# Patient Record
Sex: Female | Born: 1935 | Race: White | Hispanic: No | Marital: Married
Health system: Southern US, Community
[De-identification: ages and names within clinical notes are randomized; demographics above are authoritative.]

---

## 2005-01-29 ENCOUNTER — Ambulatory Visit: Payer: Self-pay | Admitting: Internal Medicine

## 2010-05-27 ENCOUNTER — Ambulatory Visit: Payer: Self-pay | Admitting: Family Medicine

## 2011-06-22 ENCOUNTER — Ambulatory Visit: Payer: Self-pay | Admitting: Family Medicine

## 2011-07-21 ENCOUNTER — Ambulatory Visit: Payer: Self-pay | Admitting: Emergency Medicine

## 2011-08-10 ENCOUNTER — Ambulatory Visit: Payer: Self-pay | Admitting: Gastroenterology

## 2011-10-13 IMAGING — CT CT ABD-PELV W/ CM
1 of 3 series · 12 of 32 positions shown, 18 images · IV contrast (agent unspecified)
Comparison: none

REASON FOR EXAM: abd pain   ADD ON CR  [id number]
COMMENTS:

PROCEDURE:     CT  - CT ABDOMEN / PELVIS  W  - July 21, 2011  [DATE]
RESULT:
TECHNIQUE: CT of the abdomen and pelvis is performed utilizing 85 ml of
Msovue-TUW iodinated intravenous contrast with images reconstructed at
mm slice thickness in the axial plane and coronal plane with post contrast
delayed images reconstructed at 3.0 mm slice thickness.
There is no previous exam for comparison.

[Series 2: 3mm soft tissue · axial · 0.83mm/px · z∈[-502,-130]mm · 12 of 146 slices shown, 18 images]
[im 11/146  soft-tissue]
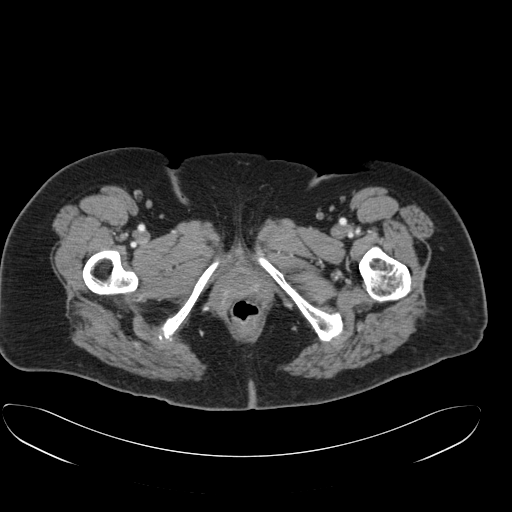
[im 11/146  bone]
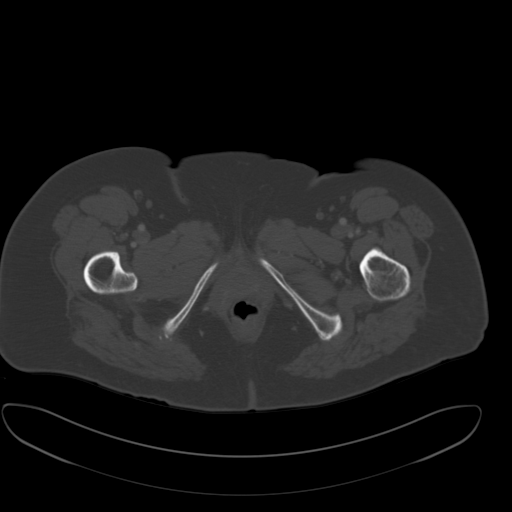
[im 21/146  soft-tissue]
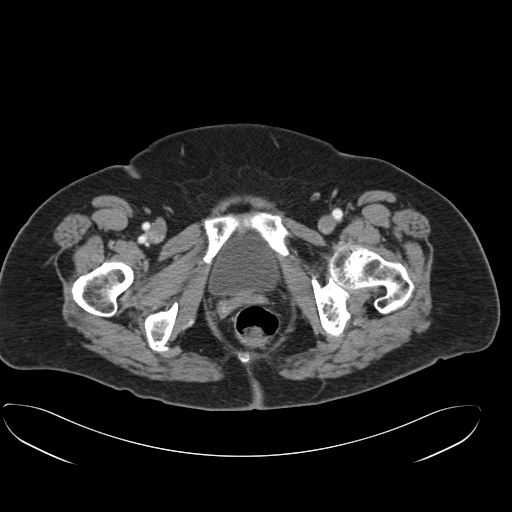
[im 32/146  soft-tissue]
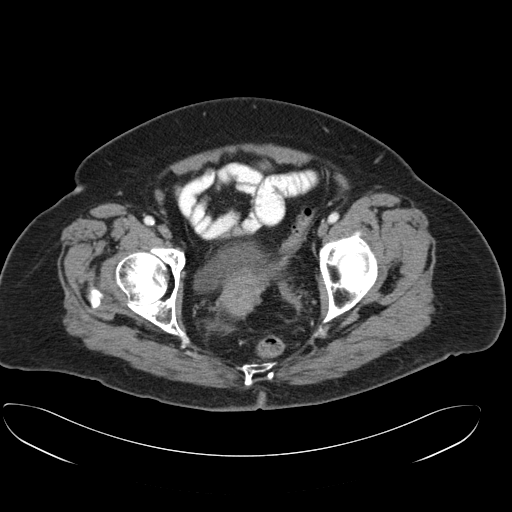
[im 42/146  soft-tissue]
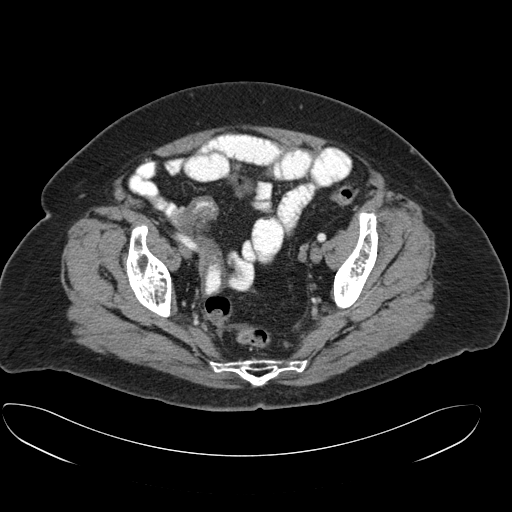
[im 52/146  soft-tissue]
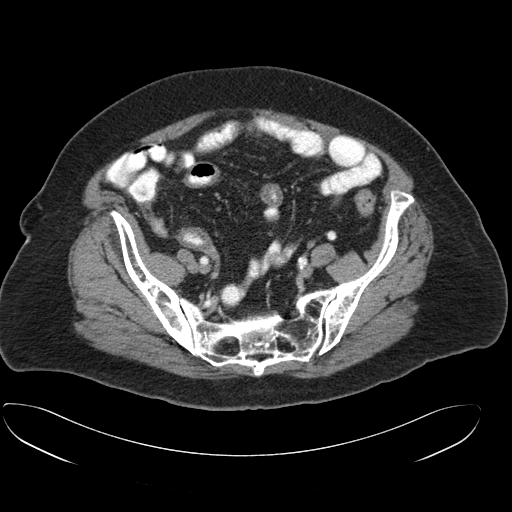
[im 63/146  soft-tissue]
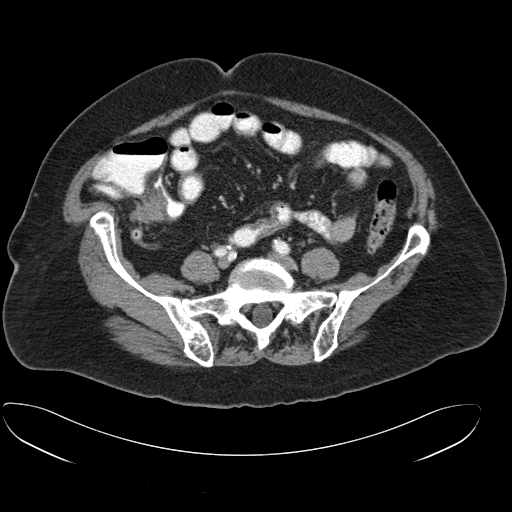
[im 83/146  soft-tissue]
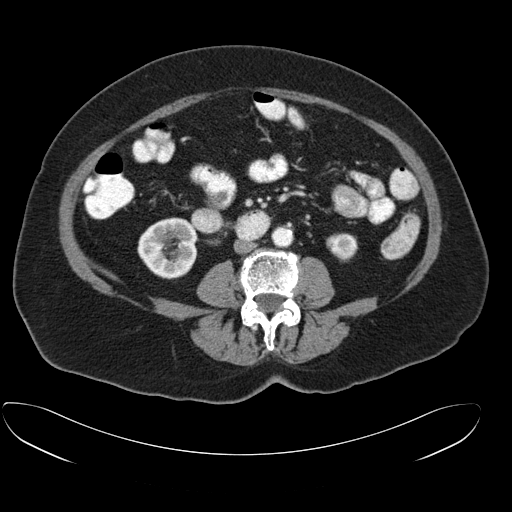
[im 94/146  soft-tissue]
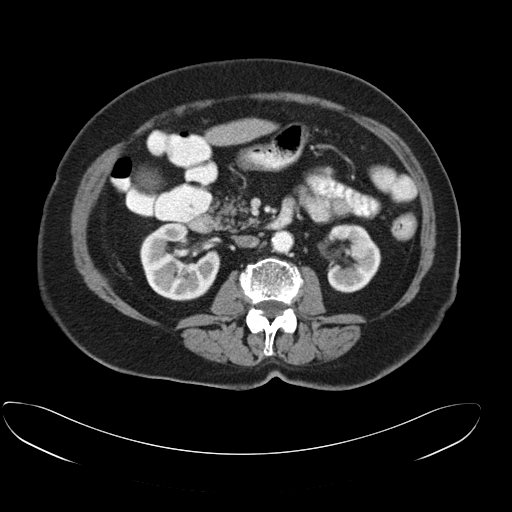
[im 104/146  soft-tissue]
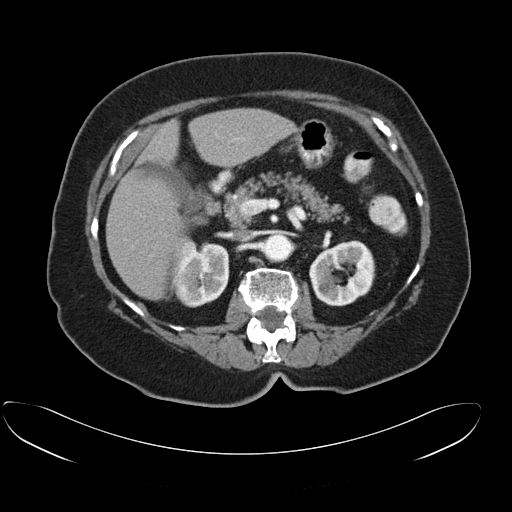
[im 104/146  lung]
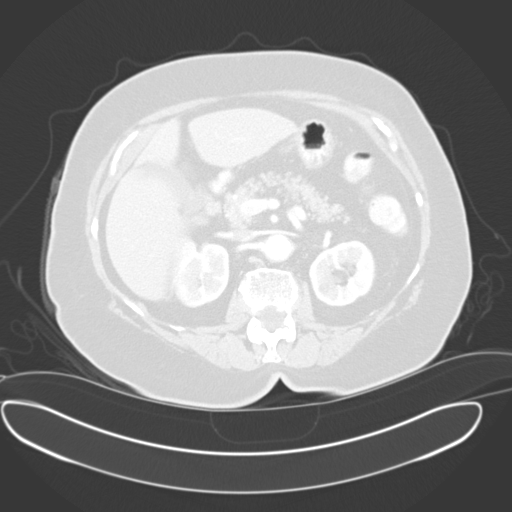
[im 104/146  bone]
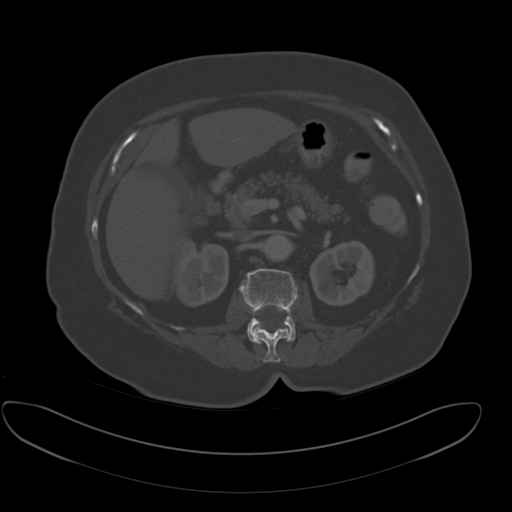
[im 114/146  soft-tissue]
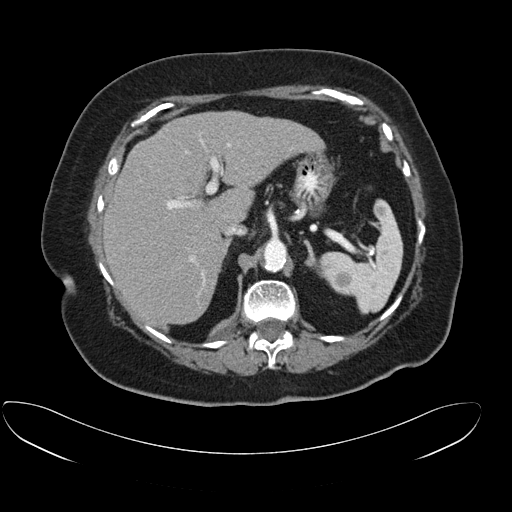
[im 114/146  lung]
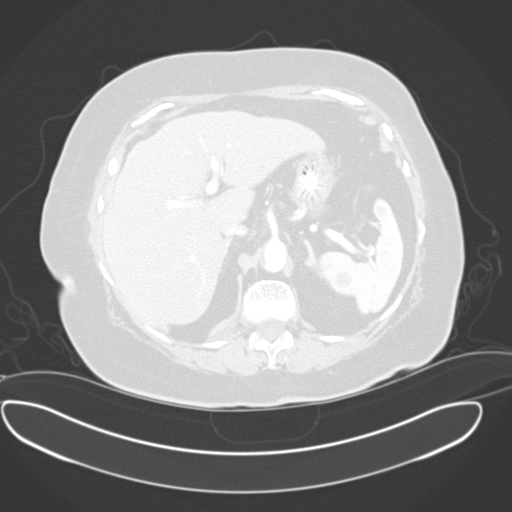
[im 125/146  soft-tissue]
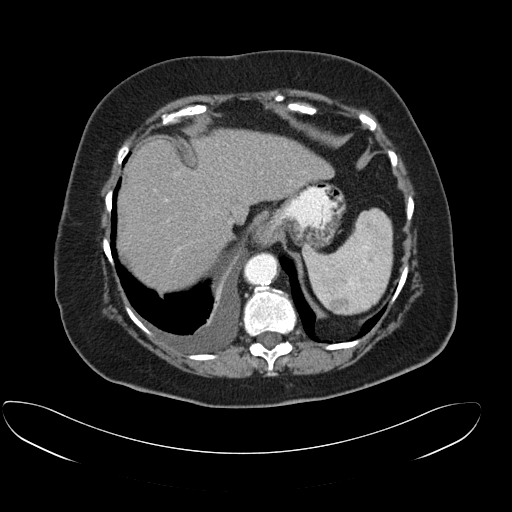
[im 125/146  lung]
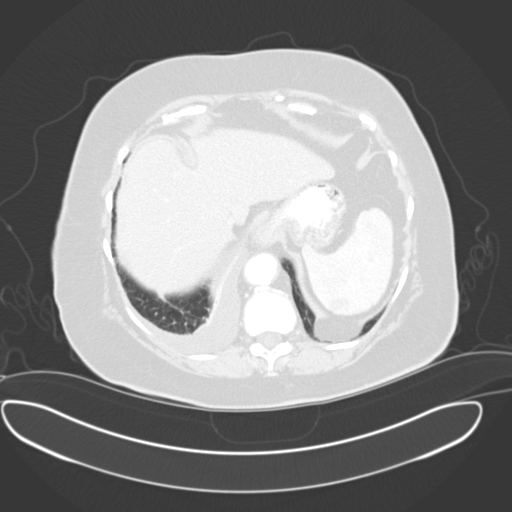
[im 135/146  soft-tissue]
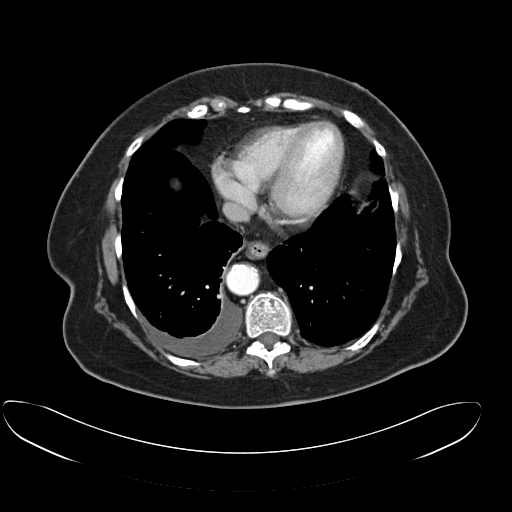
[im 135/146  lung]
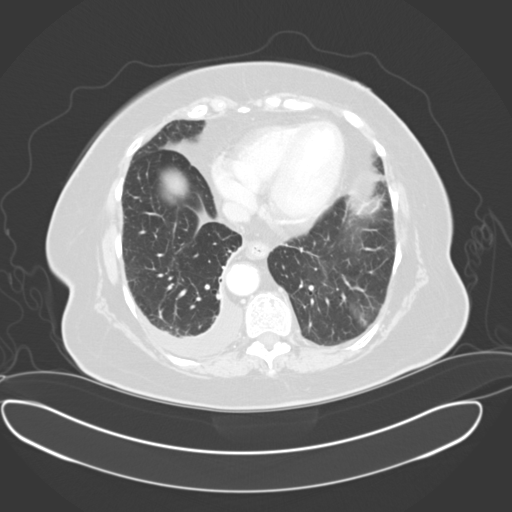

[12 of 32 positions shown; findings below may reference images not displayed]

FINDINGS: There is a small, right pleural effusion with some compressive
atelectasis in the right lower lobe. The immediate post contrast images
through the spleen show a heterogeneous pattern of enhancement with areas of
rounded, decreased enhancement without definite globular peripheral pooling
or puddling of contrast. However, on the delayed post contrast images, the
spleen shows a homogeneous pattern of enhancement suggesting that the areas
are likely hemangiomas. The gastric fundus shows slight thickening in the
wall possibly present; however, the stomach is not fully distended and as
such this could be artifact. Correlate for gastritis. The small bowel shows
intermittent areas of thickening of the wall with involvement included in
the terminal ileum. Correlate for enteritis including Crohn's disease. There
is no perforation or abscess. Colonic diverticulosis is present without
definite CT evidence of acute diverticulitis or abscess. The uterus is
present. No adnexal mass is appreciated. There is no significant ascites.
The kidneys show no obstruction or discrete mass. The adrenal glands appear
unremarkable. The pancreas, gallbladder and liver appear unremarkable. The
heart is normal in size. The lung bases show some atelectasis or fibrosis in
the lingula. The bony structures show no acute abnormality. Degenerative
changes are present with some disc space narrowing and degenerative
end-plate spurring.
IMPRESSION: 1.  Findings concerning for enteritis, possibly secondary to Crohn's
disease. An infectious rather than inflammatory etiology is not excluded.
Ischemic etiology is felt to be less likely given the relatively small
amount of atherosclerotic calcification and a grossly normal appearance of
the superior mesenteric artery origin and proximal portion.
2.  Colonic diverticular disease.
3.  Cannot exclude gastric wall thickening suggestive of gastritis.
Correlate with endoscopy.
4.  Small, right pleural effusion.
5.  Presumed splenic hemangiomata.

The findings were discussed with the requesting physician at the completion
of the exam.

## 2012-02-25 ENCOUNTER — Inpatient Hospital Stay: Payer: Self-pay | Admitting: Internal Medicine

## 2012-02-25 LAB — BASIC METABOLIC PANEL
Anion Gap: 14 (ref 7–16)
BUN: 15 mg/dL (ref 7–18)
Calcium, Total: 8.4 mg/dL — ABNORMAL LOW (ref 8.5–10.1)
EGFR (African American): >60
EGFR (Non-African Amer.): 57 — ABNORMAL LOW
Glucose: 107 mg/dL — ABNORMAL HIGH (ref 65–99)
Potassium: 3.8 mmol/L (ref 3.5–5.1)

## 2012-02-25 LAB — CBC
HCT: 24.1 % — ABNORMAL LOW (ref 35.0–47.0)
HGB: 7.2 g/dL — ABNORMAL LOW (ref 12.0–16.0)
MCH: 26.9 pg (ref 26.0–34.0)

## 2012-02-25 LAB — TROPONIN I: Troponin-I: 0.07 ng/mL — ABNORMAL HIGH

## 2012-02-25 LAB — CK TOTAL AND CKMB (NOT AT ARMC): CK-MB: 0.9 ng/mL (ref 0.5–3.6)

## 2012-02-26 LAB — TROPONIN I: Troponin-I: 0.07 ng/mL — ABNORMAL HIGH

## 2012-02-26 LAB — CBC WITH DIFFERENTIAL/PLATELET
Basophil #: 0.1 10*3/uL (ref 0.0–0.1)
Basophil %: 0.7 %
Eosinophil #: 0.2 10*3/uL (ref 0.0–0.7)
HGB: 8.1 g/dL — ABNORMAL LOW (ref 12.0–16.0)
Lymphocyte #: 1.5 10*3/uL (ref 1.0–3.6)
Lymphocyte %: 14.7 %
MCHC: 30.5 g/dL — ABNORMAL LOW (ref 32.0–36.0)
MCV: 88 fL (ref 80–100)
Monocyte #: 0.9 x10 3/mm (ref 0.2–0.9)
RDW: 15 % — ABNORMAL HIGH (ref 11.5–14.5)
WBC: 10.4 10*3/uL (ref 3.6–11.0)

## 2012-02-26 LAB — CK TOTAL AND CKMB (NOT AT ARMC)
CK, Total: 93 U/L (ref 21–215)
CK-MB: 0.8 ng/mL (ref 0.5–3.6)

## 2012-02-26 LAB — BASIC METABOLIC PANEL
BUN: 12 mg/dL (ref 7–18)
Chloride: 113 mmol/L — ABNORMAL HIGH (ref 98–107)
Creatinine: 0.61 mg/dL (ref 0.60–1.30)
EGFR (African American): >60
Sodium: 143 mmol/L (ref 136–145)

## 2012-02-26 LAB — HEPATIC FUNCTION PANEL A (ARMC)
Alkaline Phosphatase: 74 U/L (ref 50–136)
Bilirubin, Direct: <0.1 mg/dL (ref 0.00–0.20)
Bilirubin,Total: 0.7 mg/dL (ref 0.2–1.0)
SGOT(AST): 59 U/L — ABNORMAL HIGH (ref 15–37)

## 2012-02-26 LAB — HEMOGLOBIN: HGB: 8.4 g/dL — ABNORMAL LOW (ref 12.0–16.0)

## 2012-02-27 LAB — HEMOGLOBIN: HGB: 8.2 g/dL — ABNORMAL LOW (ref 12.0–16.0)

## 2012-02-28 LAB — CBC WITH DIFFERENTIAL/PLATELET
Basophil #: 0 10*3/uL (ref 0.0–0.1)
Eosinophil #: 0 10*3/uL (ref 0.0–0.7)
HCT: 25.8 % — ABNORMAL LOW (ref 35.0–47.0)
HGB: 8.1 g/dL — ABNORMAL LOW (ref 12.0–16.0)
Lymphocyte %: 8.8 %
MCH: 27.1 pg (ref 26.0–34.0)
MCHC: 31.2 g/dL — ABNORMAL LOW (ref 32.0–36.0)
Monocyte %: 6.3 %
Neutrophil %: 84.4 %
Platelet: 225 10*3/uL (ref 150–440)
RDW: 14.2 % (ref 11.5–14.5)
WBC: 11 10*3/uL (ref 3.6–11.0)

## 2012-02-29 LAB — URINALYSIS, COMPLETE
Bacteria: NONE SEEN
Glucose,UR: NEGATIVE mg/dL (ref 0–75)
Ketone: NEGATIVE
Leukocyte Esterase: NEGATIVE
Nitrite: NEGATIVE
Protein: NEGATIVE
RBC,UR: 1 /HPF (ref 0–5)
Specific Gravity: 1.004 (ref 1.003–1.030)
Squamous Epithelial: NONE SEEN

## 2012-02-29 LAB — HEMOGLOBIN: HGB: 7.6 g/dL — ABNORMAL LOW (ref 12.0–16.0)

## 2012-03-01 LAB — CBC WITH DIFFERENTIAL/PLATELET
Basophil #: 0 10*3/uL (ref 0.0–0.1)
HCT: 31.4 % — ABNORMAL LOW (ref 35.0–47.0)
HGB: 10 g/dL — ABNORMAL LOW (ref 12.0–16.0)
MCH: 27 pg (ref 26.0–34.0)
MCHC: 31.8 g/dL — ABNORMAL LOW (ref 32.0–36.0)
MCV: 85 fL (ref 80–100)
Monocyte %: 9 %
Neutrophil %: 80.4 %
Platelet: 202 10*3/uL (ref 150–440)
RBC: 3.7 10*6/uL — ABNORMAL LOW (ref 3.80–5.20)
WBC: 11.2 10*3/uL — ABNORMAL HIGH (ref 3.6–11.0)

## 2012-03-02 ENCOUNTER — Ambulatory Visit: Payer: Self-pay | Admitting: Internal Medicine

## 2012-03-02 LAB — CBC WITH DIFFERENTIAL/PLATELET
Basophil #: 0 10*3/uL (ref 0.0–0.1)
Eosinophil #: 0.2 10*3/uL (ref 0.0–0.7)
Lymphocyte #: 1.2 10*3/uL (ref 1.0–3.6)
Lymphocyte %: 10 %
MCHC: 31.9 g/dL — ABNORMAL LOW (ref 32.0–36.0)
Monocyte %: 9.5 %
Neutrophil #: 9.4 10*3/uL — ABNORMAL HIGH (ref 1.4–6.5)
Neutrophil %: 78.4 %
RDW: 14.2 % (ref 11.5–14.5)
WBC: 12 10*3/uL — ABNORMAL HIGH (ref 3.6–11.0)

## 2012-03-02 LAB — BASIC METABOLIC PANEL
Calcium, Total: 8 mg/dL — ABNORMAL LOW (ref 8.5–10.1)
Chloride: 108 mmol/L — ABNORMAL HIGH (ref 98–107)
Co2: 25 mmol/L (ref 21–32)
Creatinine: 0.69 mg/dL (ref 0.60–1.30)
EGFR (Non-African Amer.): >60
Glucose: 105 mg/dL — ABNORMAL HIGH (ref 65–99)
Osmolality: 283 (ref 275–301)
Potassium: 2.9 mmol/L — ABNORMAL LOW (ref 3.5–5.1)

## 2012-03-03 LAB — BASIC METABOLIC PANEL
BUN: 5 mg/dL — ABNORMAL LOW (ref 7–18)
EGFR (African American): >60
EGFR (Non-African Amer.): >60
Potassium: 3.3 mmol/L — ABNORMAL LOW (ref 3.5–5.1)

## 2012-03-03 LAB — CBC WITH DIFFERENTIAL/PLATELET
Basophil #: 0.1 10*3/uL (ref 0.0–0.1)
Eosinophil #: 0.3 10*3/uL (ref 0.0–0.7)
Eosinophil %: 3.2 %
HCT: 32 % — ABNORMAL LOW (ref 35.0–47.0)
HGB: 10.4 g/dL — ABNORMAL LOW (ref 12.0–16.0)
Lymphocyte #: 1.2 10*3/uL (ref 1.0–3.6)
Lymphocyte %: 12.7 %
MCHC: 32.4 g/dL (ref 32.0–36.0)
MCV: 85 fL (ref 80–100)
Monocyte #: 1 x10 3/mm — ABNORMAL HIGH (ref 0.2–0.9)
Monocyte %: 11 %
Neutrophil %: 72 %
RBC: 3.76 10*6/uL — ABNORMAL LOW (ref 3.80–5.20)
RDW: 14.3 % (ref 11.5–14.5)

## 2012-03-05 LAB — BASIC METABOLIC PANEL
Anion Gap: 9 (ref 7–16)
BUN: 5 mg/dL — ABNORMAL LOW (ref 7–18)
Calcium, Total: 7.9 mg/dL — ABNORMAL LOW (ref 8.5–10.1)
Chloride: 110 mmol/L — ABNORMAL HIGH (ref 98–107)
Creatinine: 0.64 mg/dL (ref 0.60–1.30)
EGFR (African American): >60
Osmolality: 285 (ref 275–301)
Potassium: 3.1 mmol/L — ABNORMAL LOW (ref 3.5–5.1)

## 2012-03-05 LAB — CANCER ANTIGEN 19-9: CA 19-9: 169 U/mL — ABNORMAL HIGH (ref 0–35)

## 2012-03-05 LAB — CA 125: CA 125: 40.9 U/mL — ABNORMAL HIGH (ref 0.0–34.0)

## 2012-03-05 LAB — HEMOGLOBIN: HGB: 10 g/dL — ABNORMAL LOW (ref 12.0–16.0)

## 2012-03-05 LAB — CANCER ANTIGEN 27.29: CA 27.29: 9.1 U/mL (ref 0.0–38.6)

## 2012-03-05 LAB — MAGNESIUM: Magnesium: 1.8 mg/dL

## 2012-03-06 LAB — POTASSIUM: Potassium: 3.5 mmol/L (ref 3.5–5.1)

## 2012-03-07 ENCOUNTER — Emergency Department: Payer: Self-pay | Admitting: Emergency Medicine

## 2012-03-07 LAB — CBC
HCT: 35.6 % (ref 35.0–47.0)
HGB: 11.3 g/dL — ABNORMAL LOW (ref 12.0–16.0)
MCH: 27.1 pg (ref 26.0–34.0)
MCHC: 31.7 g/dL — ABNORMAL LOW (ref 32.0–36.0)
MCV: 85 fL (ref 80–100)
Platelet: 248 10*3/uL (ref 150–440)
RBC: 4.17 10*6/uL (ref 3.80–5.20)
RDW: 15 % — ABNORMAL HIGH (ref 11.5–14.5)
WBC: 9.9 10*3/uL (ref 3.6–11.0)

## 2012-03-07 LAB — COMPREHENSIVE METABOLIC PANEL
Albumin: 3.3 g/dL — ABNORMAL LOW (ref 3.4–5.0)
Alkaline Phosphatase: 114 U/L (ref 50–136)
Anion Gap: 9 (ref 7–16)
BUN: 12 mg/dL (ref 7–18)
Bilirubin,Total: 0.4 mg/dL (ref 0.2–1.0)
Creatinine: 0.67 mg/dL (ref 0.60–1.30)
EGFR (African American): >60
Glucose: 138 mg/dL — ABNORMAL HIGH (ref 65–99)
SGOT(AST): 44 U/L — ABNORMAL HIGH (ref 15–37)
Total Protein: 6.9 g/dL (ref 6.4–8.2)

## 2012-03-08 LAB — PATHOLOGY REPORT

## 2012-04-02 ENCOUNTER — Ambulatory Visit: Payer: Self-pay | Admitting: Internal Medicine

## 2012-05-30 IMAGING — CR DG ABDOMEN 3V
1 series · 4 of 4 positions shown · non-contrast
Comparison: none

REASON FOR EXAM: abdominal pain
COMMENTS:

[Series 4: x chest ap · 0.14mm/px · 4 of 4 slices shown]
[im 1/4]
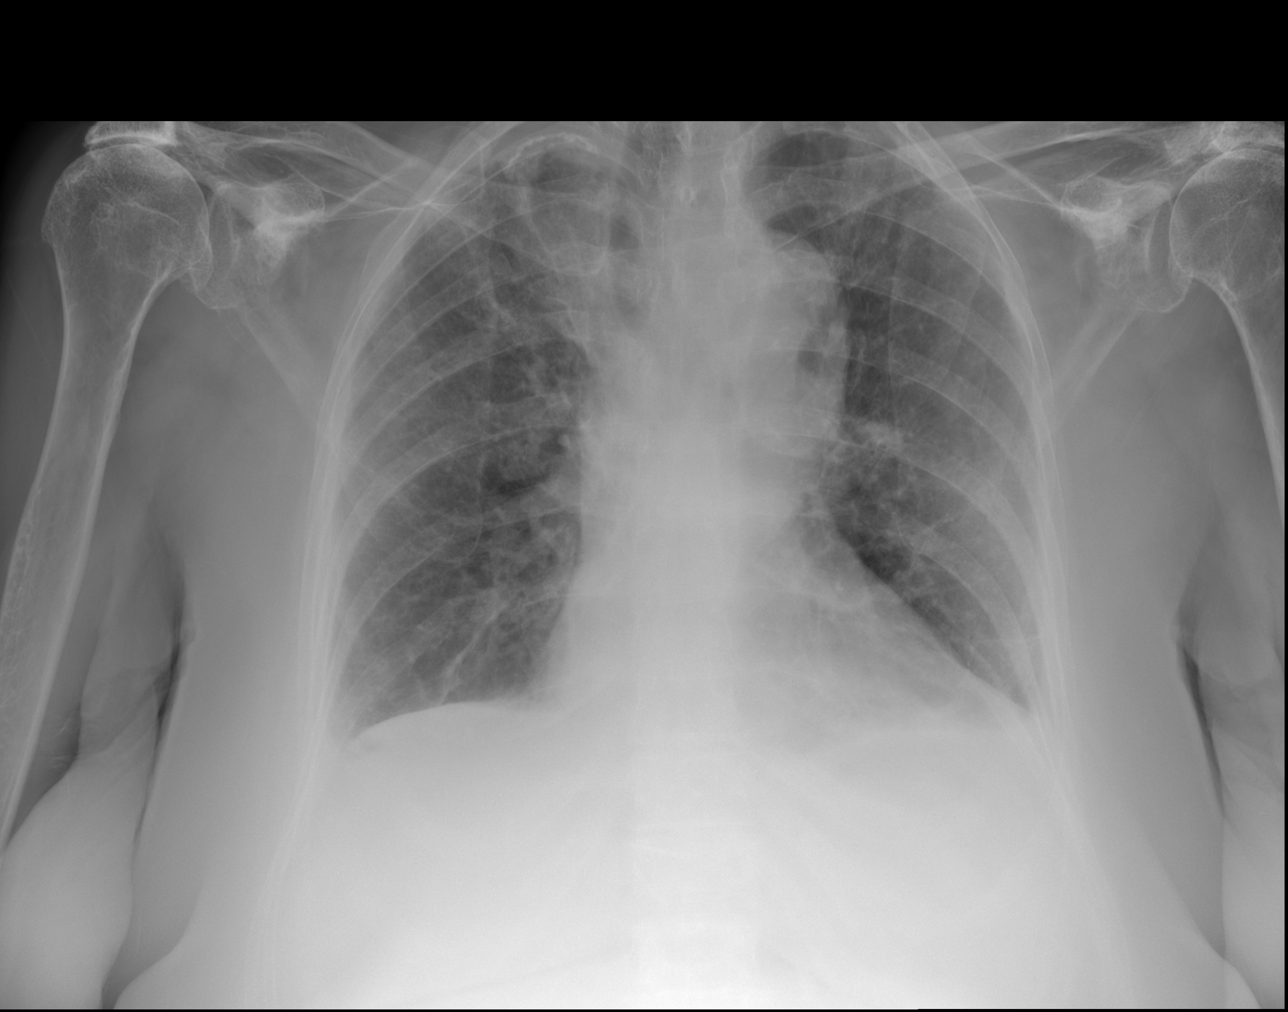
[im 2/4]
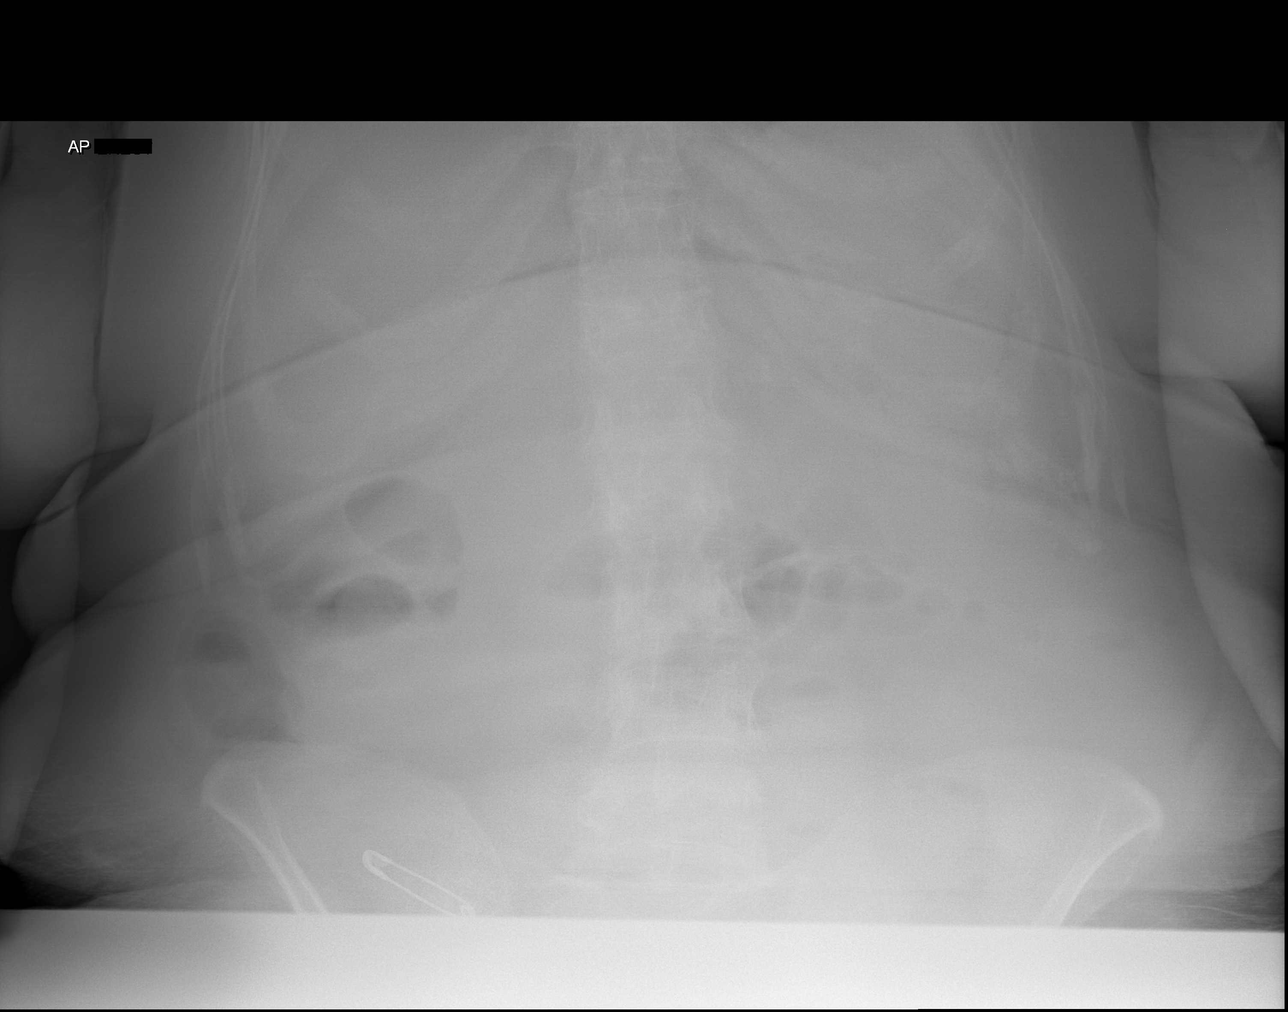
[im 3/4]
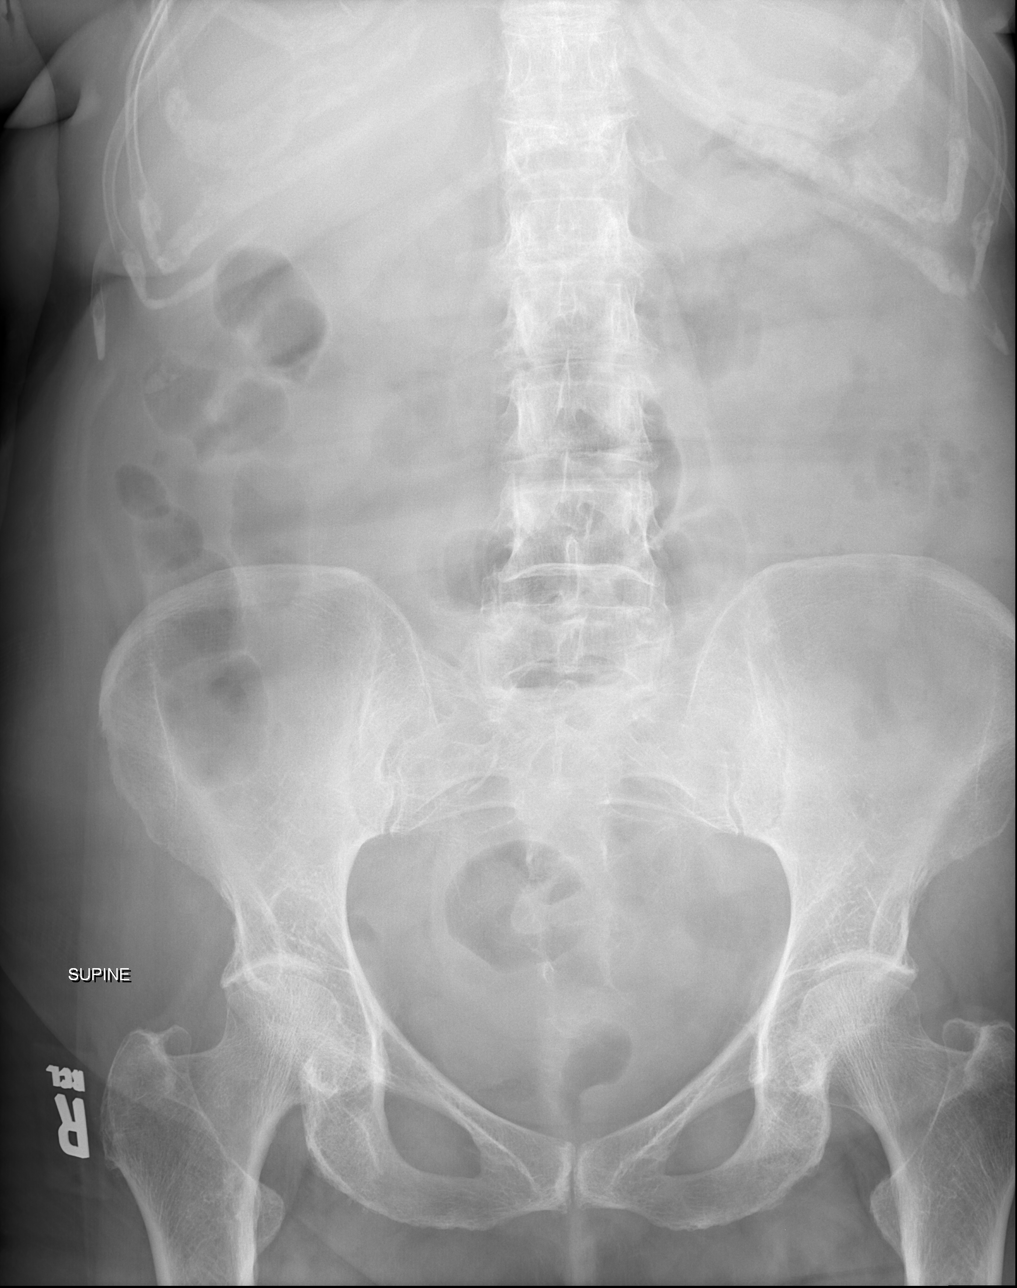
[im 4/4]
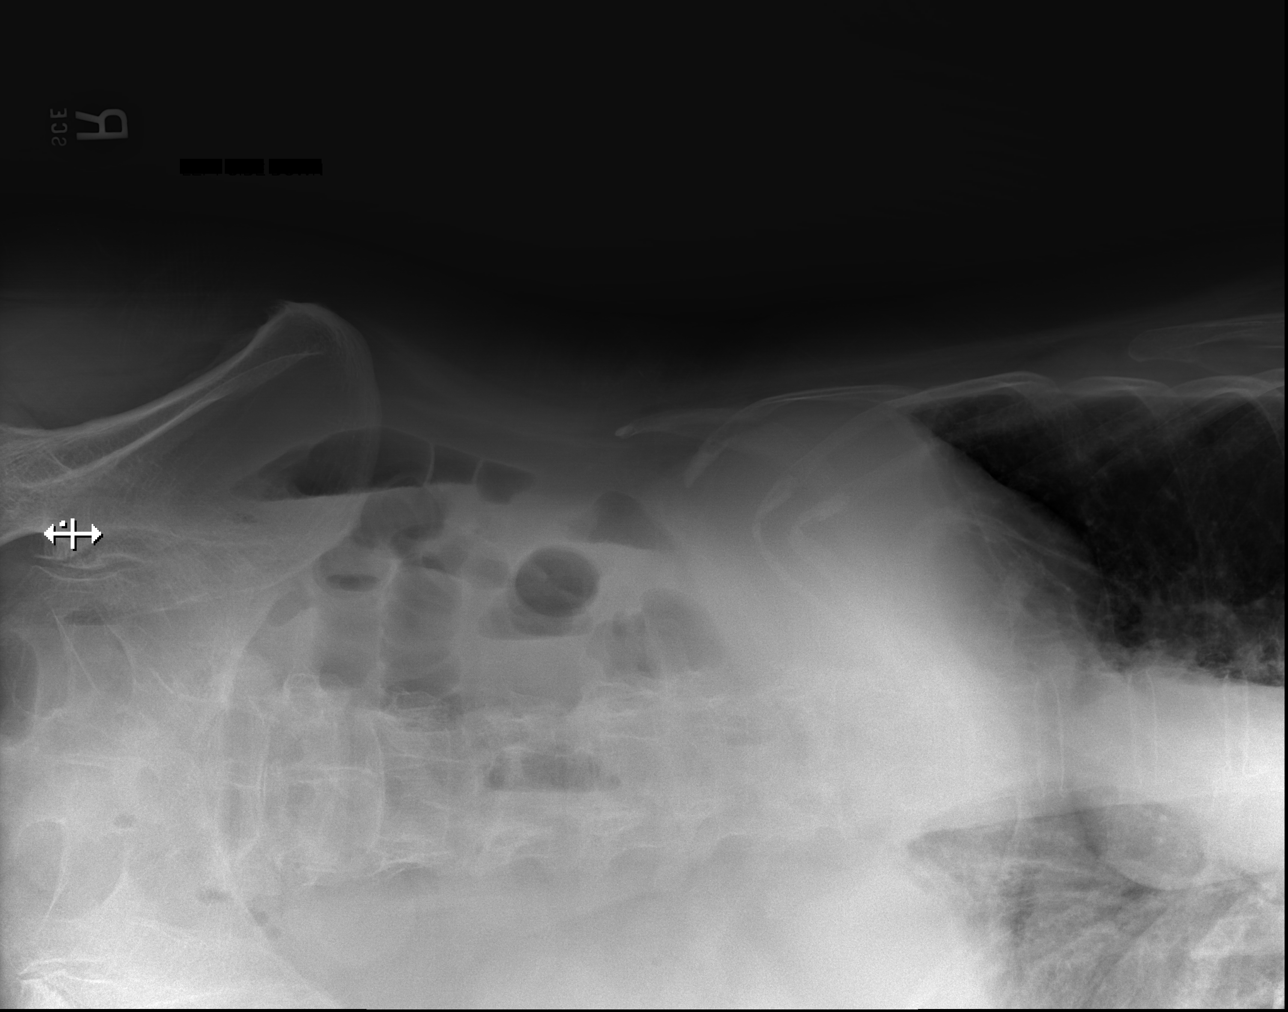

[4 of 4 positions shown; findings below may reference images not displayed]

PROCEDURE:     DXR - DXR ABDOMEN 3-WAY (INCL PA CXR)  - March 07, 2012 [DATE]

RESULT:

Comparison is made to a two view chest dated 03/03/2012.

Frontal view of the chest is performed.

The patient has taken a shallow inspiration. There is thickening of the
interstitial markings. Area of increased density projects in the left lung
base. There is blunting of the costophrenic angle. The cardiac silhouette is
moderately enlarged. An area of increased density projects in the right
upper lobe and slightly decreasing conspicuity when compared to the previous
study.

Air is seen within nondilated loops of large and small bowel. There is no
evidence of free air. The visualized bony skeleton is unremarkable.
IMPRESSION: 1.  Interstitial findings which may represent a component of pulmonary
fibrosis. Underlying component of pulmonary vascular congestion is also of
diagnostic consideration.
2.  Nonobstructive bowel gas pattern.

## 2013-03-02 DEATH — deceased

## 2015-02-24 NOTE — Consult Note (Signed)
EGD only showed hiatal hernia. No bleeding source found. Will keep on clear liquid diet. Will plan on colonoscopy on Wed. [facility] So, will not be able to schedule colon tomorrow. Pt aware. Thanks  Electronic Signatures: Lutricia Feilh, Glennda Weatherholtz (MD)  (Signed on 29-Apr-13 15:24)  Authored  Last Updated: 29-Apr-13 15:24 by Lutricia Feilh, Horace Wishon (MD)

## 2015-02-24 NOTE — Consult Note (Signed)
Chief Complaint:   Subjective/Chief Complaint Colonoscopy had to be postponed until tomorrow due to low K. Currently being repleted. Abd pain improved on liquid diet.   VITAL SIGNS/ANCILLARY NOTES: **Vital Signs.:   01-May-13 16:09   Vital Signs Type Routine   Temperature Temperature (F) 99.2   Celsius 37.3   Temperature Source oral   Pulse Pulse 70   Pulse source per Dinamap   Respirations Respirations 20   Systolic BP Systolic BP 481   Diastolic BP (mmHg) Diastolic BP (mmHg) 66   Mean BP 87   BP Source Dinamap   Pulse Ox % Pulse Ox % 92   Pulse Ox Activity Level  At rest   Oxygen Delivery Room Air/ 21 %   Brief Assessment:   Cardiac Regular    Respiratory clear BS    Gastrointestinal soft and min tenderness in low abdomen   Routine Hem:  01-May-13 04:49    WBC (CBC) 12.0   RBC (CBC) 3.82   Hemoglobin (CBC) 10.5   Hematocrit (CBC) 32.9   Platelet Count (CBC) 199   MCV 86   MCH 27.5   MCHC 31.9   RDW 14.2  Routine Chem:  01-May-13 04:49    Glucose, Serum 105   BUN 6   Creatinine (comp) 0.69   Sodium, Serum 143   Potassium, Serum 2.9   Chloride, Serum 108   CO2, Serum 25   Calcium (Total), Serum 8.0   Anion Gap 10   Osmolality (calc) 283   eGFR (African American) >60   eGFR (Non-African American) >60  Routine Hem:  01-May-13 04:49    Neutrophil % 78.4   Lymphocyte % 10.0   Monocyte % 9.5   Eosinophil % 1.7   Basophil % 0.4   Neutrophil # 9.4   Lymphocyte # 1.2   Monocyte # 1.1   Eosinophil # 0.2   Basophil # 0.0   Assessment/Plan:  Assessment/Plan:   Assessment Fe def anemia. Heme positive stool.    Plan Needs k to be above 3 tomorrow to plan colonoscopy. Thanks.   Electronic Signatures: Verdie Shire (MD)  (Signed 01-May-13 17:45)  Authored: Chief Complaint, VITAL SIGNS/ANCILLARY NOTES, Brief Assessment, Lab Results, Assessment/Plan   Last Updated: 01-May-13 17:45 by Verdie Shire (MD)

## 2015-02-24 NOTE — Consult Note (Signed)
Full consult to follow. Admitted with upper abd pain and cramping assoc with melena. Hgb low. Transfused last night. hHad HH on EGD last year with Dr. Cecelia ByarsHashmi. Video capsule neg. On advil prn. Hx of sigmoid polyp and sigmoid tics 6 yrs ago. Pt with rapid afib. HR still not under control. Initially had planned on doing EGD later today but will need to postpone until HR better controlled. Liquid diet ordered. Protonix iv. Moniter hgb. Dr. Niel HummerIftikhar to see patient over the weekend. Plan EGD on Monday? or sooner depending on her condition.Colonoscopy later if EGD neg. Thanks.    Electronic Signatures: Lutricia Feilh, Rainn Zupko (MD) (Signed on 26-Apr-13 11:53)  Authored   Last Updated: 26-Apr-13 11:54 by Lutricia Feilh, Vonnie Ligman (MD)

## 2015-02-24 NOTE — Consult Note (Signed)
Colonoscopy showed multiple left sided tics, possible sigmoid polyp vs fecal matter, prominent ileocecal valve. Unable to get direct view of cecum due to this valve. Biopsies prox to IC valve taken due to some erythema. Reviewed old CT report from 2012. Question of ileitis. Repeat CT of abd/pelvis with contrast please. Thanks.  Electronic Signatures: Lutricia Feilh, Henrietta Cieslewicz (MD)  (Signed on 02-May-13 13:59)  Authored  Last Updated: 02-May-13 13:59 by Lutricia Feilh, Brazos Sandoval (MD)

## 2015-02-24 NOTE — Consult Note (Signed)
Chief Complaint:   Subjective/Chief Complaint No more melena but hgb dropping. To receive transfusion today.Pt in NSR now.Though nurse stated she had some breakfast this AM, patient insists she had nothing to eat. Son ate part of meal. Had sip of gingerale at 11 AM. Requests EGD be done today if possible. Still with some abd pain.   VITAL SIGNS/ANCILLARY NOTES: **Vital Signs.:   29-Apr-13 10:00   Vital Signs Type Routine   Pulse Pulse 76   Respirations Respirations 22   Systolic BP Systolic BP 159   Diastolic BP (mmHg) Diastolic BP (mmHg) 69   Mean BP 99   Pulse Ox % Pulse Ox % 95   Pulse Ox Heart Rate 76   Brief Assessment:   Cardiac Regular    Respiratory clear BS    Gastrointestinal mild abd tenderness   Routine UA:  29-Apr-13 09:40    Clarity (UA) Clear   Glucose (UA) Negative   Bilirubin (UA) Negative   Ketones (UA) Negative   Specific Gravity (UA) 1.004   Blood (UA) 1+   pH (UA) 5.0   Protein (UA) Negative   Nitrite (UA) Negative   Leukocyte Esterase (UA) Negative   RBC (UA) 1 /HPF   WBC (UA) <1 /HPF   Mucous (UA) PRESENT   Assessment/Plan:  Assessment/Plan:   Assessment Abd pain. Anemia. Melena.    Plan Keep NPO. For EGD later this afternoon.   Electronic Signatures: Lutricia Feilh, Zharia Conrow (MD)  (Signed 29-Apr-13 12:02)  Authored: Chief Complaint, VITAL SIGNS/ANCILLARY NOTES, Brief Assessment, Lab Results, Assessment/Plan   Last Updated: 29-Apr-13 12:02 by Lutricia Feilh, Avalina Benko (MD)

## 2015-02-24 NOTE — Consult Note (Signed)
Chief Complaint:   Subjective/Chief Complaint Pt with minimal abd pain. Passage of old blood per rectum. Hgb remains stable.   VITAL SIGNS/ANCILLARY NOTES: **Vital Signs.:   05-May-13 10:19   Vital Signs Type Recheck   Systolic BP Systolic BP 155   Diastolic BP (mmHg) Diastolic BP (mmHg) 65   Mean BP 95   BP Source manual   Brief Assessment:   Respiratory clear BS    Gastrointestinal min abd tenderness   Routine Chem:  05-May-13 06:30    Potassium, Serum 3.5   Assessment/Plan:  Assessment/Plan:   Assessment Fed def anemia. Heme positive stool. Prob colon cancer.    Plan Agree that patient can be discharged to home today with f/u later once biopsies results are back. Thanks.   Electronic Signatures: Lutricia Feilh, Dustina Scoggin (MD)  (Signed [phone]-May-13 10:43)  Authored: Chief Complaint, VITAL SIGNS/ANCILLARY NOTES, Brief Assessment, Lab Results, Assessment/Plan   Last Updated: 05-May-13 10:43 by Lutricia Feilh, Jeury Mcnab (MD)

## 2015-02-24 NOTE — Consult Note (Signed)
PATIENT NAME:  Theresa Gaines, Theresa Gaines MR#:  [id number] DATE OF BIRTH:  07/18/1928  DATE OF CONSULTATION:  02/26/2012  REFERRING PHYSICIAN:   CONSULTING PHYSICIAN:  Ezzard Standing. Eileen Croswell, MD  REASON FOR REFERRAL: Melena and abdominal pain and cramping.   HISTORY OF PRESENT ILLNESS: The patient is an 81 year old white female with a history of hyperlipidemia and osteoporosis who was seen by her primary doctor yesterday and then was sent home. She has been complaining of abdominal cramping with pain on and off for the past two weeks or so. For the last several days, she has also noticed some melena as well. The pain is mostly in the upper abdomen. She has been taking some Advil p.r.n. for various reasons. In the emergency room, her hemoglobin was only 7.2. In the emergency room, she was also noted to have rapid atrial fibrillation with heart rate in the 140s, which is new onset. The patient denied having any prior cardiac history. There is no chest pain or shortness of breath.   She was initially admitted to the regular floor first, but then had to be transferred to the Intensive Care Unit earlier today because of consistently rapid heart rate. The patient is currently getting IV Cardizem. When I saw her, her heart rate fluctuated from 110 to sometimes even in the 140s.   The patient recalls having an endoscopy done by Dr. Jovita Gamma in September 2012. It is unclear whether it was for the same reason or not, but she have some abdominal cramping then too. The only thing she knows was that she had a hiatal hernia. There is no mention of ulcers then. She then had a video capsule study done the following month by Dr. Niel Hummer and she was told everything was normal.   PAST SURGICAL HISTORY: Colonoscopy by Dr. Caryn Section which showed a sigmoid polyp as well as sigmoid diverticula.  PAST MEDICAL HISTORY:  1. Osteoporosis.  2. Hyperlipidemia.   HOME MEDICATIONS: Allegra, Evista, iron, Prilosec over-the-counter, and Geritol.  She also takes Zocor and Zolpidem.   REVIEW OF SYSTEMS: There is really no change from the initial review of systems done by Dr. Allena Katz last night.   PHYSICAL EXAMINATION:   GENERAL: The patient is alert and oriented. She is in no distress.   VITALS: Heart rate is still rapid, although it is not as high as last night. Blood pressure is stable. Oxygen saturation is 100%.   HEENT: Normocephalic, atraumatic head. Pupils are equally reactive. Throat was clear.   NECK: Supple.   CARDIAC: Somewhat rapid tachycardic rhythm and somewhat irregular.  LUNGS: Clear bilaterally.   ABDOMEN: Normoactive bowel sounds. Abdomen is soft. There is some mild tenderness mostly in the mid to upper abdomen. There is no rebound or guarding. There is no hepatomegaly. There are no palpable masses.   EXTREMITIES: No clubbing, cyanosis, or edema.   NEUROLOGIC: Examination is intact. There is no focal deficits.  SKIN: Appears grossly normal.   LABS/STUDIES: Today sodium is 143, potassium 3.8, chloride 113, CO2 19, BUN 12, creatinine 1.6, and glucose 109. Liver enzymes showed albumin of 2.9 and AST 15. The rest of the liver enzymes are normal. Troponin level is 0.07. White count is 10.4, hemoglobin was 7.2 yesterday, and after blood transfusion it is 8.1 today.   ASSESSMENT AND PLAN: This is a patient with abdominal cramping and pain and melena with low hemoglobin. She has been taking some Advil. Even though the endoscopy done in September was supposedly negative for  any bleeding source, I think she would benefit from having a repeat endoscopy again. Unfortunately, the patient's heart rate is still not adequately controlled so instead of scheduling the upper endoscopy later today we will postpone it until the heart rate is better. We will keep the patient on clear liquids the rest of today. I will have Dr. Niel HummerIftikhar see her over the weekend and then decide what would be the best time to repeat endoscopy. It could be over  the weekend if she is actively bleeding or on Monday if she is relatively stable. She may benefit from having a repeat colonoscopy done at some point because of the history of polyps. If upper endoscopy is negative, then we will do the colonoscopy while she is in hospital. On the other hand, if a bleeding source is found, then we can do the colonoscopy at a later point as an outpatient.  ____________________________ Ezzard StandingPaul Y. Bluford Kaufmannh, MD pyo:slb D: 02/26/2012 14:11:36 ET T: 02/26/2012 15:00:21 ET JOB#: [id number]  cc: Ezzard StandingPaul Y. Bluford Kaufmannh, MD, <Dictator> Ezzard StandingPAUL Y Emberly Tomasso MD ELECTRONICALLY SIGNED 02/29/2012 9:04

## 2015-02-24 NOTE — Consult Note (Signed)
Chief Complaint:   Subjective/Chief Complaint Pt feels well. S/P scope yesterday. No furher evidence of bleeding. She denies pals or tachy. No cp   VITAL SIGNS/ANCILLARY NOTES: **Vital Signs.:   30-Apr-13 08:05   Vital Signs Type Routine   Temperature Temperature (F) 98.9   Celsius 37.1   Temperature Source oral   Pulse Pulse 69   Pulse source per Dinamap   Respirations Respirations 18   Systolic BP Systolic BP 148   Diastolic BP (mmHg) Diastolic BP (mmHg) 75   Mean BP 99   BP Source Dinamap   Pulse Ox % Pulse Ox % 92   Pulse Ox Activity Level  At rest   Oxygen Delivery Room Air/ 21 %  *Intake and Output.:   Daily 30-Apr-13 07:00   Grand Totals Intake:  1066.6 Output:  4465    Net:  -3398.4 24 Hr.:  -3398.4   Blood Product      In:  710   IV (Primary)      In:  16.6   IV (Primary)      In:  340   Urine ml     Out:  3465   Urine Post Catheter Insertion      Out:  1000   Length of Stay Totals Intake:  8566.1 Output:  8240    Net:  326.1   Brief Assessment:   Cardiac Regular    Respiratory normal resp effort  clear BS    Gastrointestinal Normal    Gastrointestinal details normal Soft  Nontender  Nondistended   Routine Hem:  30-Apr-13 04:45    WBC (CBC) 11.2   RBC (CBC) 3.70   Hemoglobin (CBC) 10.0   Hematocrit (CBC) 31.4   Platelet Count (CBC) 202   MCV 85   MCH 27.0   MCHC 31.8   RDW 14.3   Neutrophil % 80.4   Lymphocyte % 8.9   Monocyte % 9.0   Eosinophil % 1.6   Basophil % 0.1   Neutrophil # 9.0   Lymphocyte # 1.0   Monocyte # 1.0   Eosinophil # 0.2   Basophil # 0.0   Radiology Results: XRay:    28-Apr-13 09:05, Chest Portable Single View   Chest Portable Single View    REASON FOR EXAM:    SOB  COMMENTS:       PROCEDURE: DXR - DXR PORTABLE CHEST SINGLE VIEW  - Feb 28 2012  9:05AM     RESULT: Comparison: None    Findings:     Single portable AP chest radiograph is provided. There is bilateral   diffuse interstitial thickening likely  representing interstitial edema   versus interstitial pneumonitis secondary to an infectious or   inflammatory etiology. There bilateral trace pleural effusions. There is   no focal consolidation. There is no pneumothorax. Normal   cardiomediastinal silhouette. There is complete loss of the normal     acromiohumeral distance bilaterally as can be seen with rotator cuff   tears.    IMPRESSION:     There is bilateral diffuse interstitial thickening likely representing   interstitialedema versus interstitial pneumonitis secondary to an   infectious or inflammatory etiology.    Dictation Site: 3          Verified By: Joellyn Haff, M.D., MD  Cardiology:    25-Apr-13 12:21, ECG   Ventricular Rate 131   Atrial Rate 113   QRS Duration 66   QT 322   QTc 475   R  Axis 42   T Axis 92   ECG interpretation    Atrial fibrillation with rapid ventricular response  Nonspecific ST and T wave abnormality , probably digitalis effect  Abnormal ECG  When compared with ECG of 26-May-2000 14:34,  Atrial fibrillation has replaced Sinus rhythm  Nonspecific T wave abnormality now evident in Lateral leads  ----------unconfirmed----------  Confirmed by OVERREAD, NOT ([id number]), editor PEARSON, BARBARA ([id number]) on 02/26/2012 3:09:06 PM   ECG     25-Apr-13 18:58, Echo Doppler   Echo Doppler    Interpretation Summary    The left ventricle is grossly normal size. There is no thrombus. Left   ventricular systolic function is normal. Ejection Fraction = >55%.   There is normal left ventricular wall thickness. The left   ventricular wall motion is normal. The right ventricular systolic   function is normal. There is discrete nodular thickening of the left   coronary cusp. There is mild to moderate tricuspid regurgitation.   Right ventricular systolic pressure is elevated at 30-61mmHg.    PatientHeight: 170 cm    PatientWeight: 77 kg    BSA: 1.9 m2    Procedure:    A two-dimensional transthoracic  echocardiogram with color flow and   Doppler was performed.    The study was completed  bedside.    Technically a difficult study due to poorwindows.    Left Ventricle    Left ventricular systolic function is normal.    Ejection Fraction = >55%.    The left ventricular wall motion is normal.    The left ventricle is grossly normal size.    There is no thrombus.    There is normal left ventricular wall thickness.    Right Ventricle    The right ventricle is grossly normal size.    There is normal right ventricular wall thickness.    The right ventricular systolic function is normal.    Atria    The left atrial size is normal.    Right atrial size is normal.    Mitral Valve    The mitral valve leaflets appear thickened, but open well.    There is mild mitral regurgitation.    Tricuspid Valve    The tricuspid valve is not well visualized, but is grossly normal.    There is mild to moderate tricuspid regurgitation.    Right ventricular systolic pressure is elevated at 30-75mmHg.    Aortic Valve    There is discrete nodular thickening of the left coronary cusp.    No aortic regurgitation is present.    Pulmonic Valve    The pulmonic valve is not well seen, but is grossly normal.    There is no pulmonic valvular regurgitation.    Great Vessels    The aortic root is not well visualized but is probably normal size.    Pericardium/Pleural    There is no pleural effusion.    No pericardial effusion.    MMode 2D Measurements and Calculations    RVDd: 2.5 cm    IVSd: 1.4 cm    LVIDd: 4.2 cm    LVIDs: 2.9 cm    LVPWd: 1.4 cm    FS: 32 %    EF(Teich): 60 %    Ao root diam: 3.0 cm    ACS: 1.6 cm    LA dimension: 3.6 cm    LVOT diam: 1.9 cm    Doppler Measurements and Calculations    MV E point: 161 cm/sec  MV V2 max: 145 cm/sec    MV max PG: 8.0 mmHg    MV V2 mean: 81 cm/sec    MV mean PG: 3.0 mmHg    MV V2 VTI: 31 cm    MV P1/2t max vel: 162 cm/sec    MV P1/2t:  69 msec    MVA(P1/2t): 3.2 cm2    MV dec slope: 683 cm/sec2    MV dec time: 0.22 sec    Ao V2 max: 194 cm/sec    Ao max PG: 15 mmHg    Ao V2 mean: 113 cm/sec    Ao mean PG: 6.2 mmHg    Ao V2 VTI: 29 cm    AVA(I,D): 2.1 cm2    AVA(V,D): 1.8 cm2    LV max PG: 6.0 mmHg    LV mean PG: 2.5 mmHg    LV V1 max: 123 cm/sec    LV V1 mean: 71 cm/sec    LV V1 VTI: 21 cm    MR max vel: 377 cm/sec    MR max PG: 57 mmHg    SV(LVOT): 61 ml    PA V2 max: 109 cm/sec    PA max PG: 5.0 mmHg    PA acc time: 0.14 sec    TR Max vel: 278 cm/sec    TR Max PG: 31 mmHg    RVSP: 36 mmHg    RAP systole: 5.0 mmHg    PA pr(Accel): 16 mmHg    Reading Physician: Dorothyann Pengallwood, Dwayne   Sonographer: Andi HenceSabir, Rizwan  Interpreting Physician:  Dorothyann Pengwayne Callwood,  electronically signed on   02-26-2012 14:35:36  Requesting Physician: Dorothyann Pengallwood, Dwayne   Assessment/Plan:  Assessment/Plan:   Assessment IMP AFIB-NSR Palp Tachy HTN Obesity Abn Ekg Anemia GI bleeding    Plan PLAN Continue amiodarone No anticoug for now Agree with GI w/u Wgt loss PPI po as needed Continue Bp control Low dose B-blockers for now No indication for cath No need for EP study or ablation   Electronic Signatures: Dorothyann Pengallwood, Dwayne D (MD)  (Signed 30-Apr-13 10:45)  Authored: Chief Complaint, VITAL SIGNS/ANCILLARY NOTES, Brief Assessment, Lab Results, Radiology Results, Assessment/Plan   Last Updated: 30-Apr-13 10:45 by Alwyn Peaallwood, Dwayne D (MD)

## 2015-02-24 NOTE — Consult Note (Signed)
Chief Complaint:   Subjective/Chief Complaint No new complaints. Continues to be in A. fibb with HR upto 120. H and H stable. One brown BM today.   VITAL SIGNS/ANCILLARY NOTES: **Vital Signs.:   27-Apr-13 09:00   Vital Signs Type Routine   Pulse Pulse 108   Pulse source per cardiac monitor   Respirations Respirations 29   Systolic BP Systolic BP 121   Diastolic BP (mmHg) Diastolic BP (mmHg) 96   Mean BP 104   BP Source non-invasive   Pulse Ox % Pulse Ox % 100   Pulse Ox Activity Level  At rest   Oxygen Delivery Room Air/ 21 %   Pulse Ox Heart Rate 98   Brief Assessment:   Additional Physical Exam Abdomen is soft and benign.   Routine Hem:  27-Apr-13 06:58    Hemoglobin (CBC) 8.2  Routine Chem:  27-Apr-13 06:58    Glucose, Serum 129   Assessment/Plan:  Assessment/Plan:   Assessment Melena and anemia. H and H stable. No signs of active bleeding. A. fibb with rapid heart rate.    Plan Continue PPI. Continue to follow H and H and transfuse if needed. EGD when cardiac status more stable. Will follow.   Electronic Signatures: Lurline DelIftikhar, Dondi Burandt (MD)  (Signed 27-Apr-13 11:01)  Authored: Chief Complaint, VITAL SIGNS/ANCILLARY NOTES, Brief Assessment, Lab Results, Assessment/Plan   Last Updated: 27-Apr-13 11:01 by Lurline DelIftikhar, Charese Abundis (MD)

## 2015-02-24 NOTE — Consult Note (Signed)
Chief Complaint:   Subjective/Chief Complaint Pt still not well controlled heart rate. She denies palp no cp noevidence of further bleeding. Pre-op EGD/Colon.   VITAL SIGNS/ANCILLARY NOTES: **Vital Signs.:   26-Apr-13 09:30   Vital Signs Type Upon Transfer   Temperature Temperature (F) 98.4   Celsius 36.8   Temperature Source axillary   Pulse Pulse 128   Pulse source per cardiac monitor   Respirations Respirations 15   Systolic BP Systolic BP 161   Diastolic BP (mmHg) Diastolic BP (mmHg) 83   Mean BP 97   Pulse Ox % Pulse Ox % 97   Oxygen Delivery Room Air/ 21 %   Pulse Ox Heart Rate 114  *Intake and Output.:   26-Apr-13 11:00   Grand Totals Intake:  118.3 Output:      Net:  118.3 24 Hr.:  354.9   IV (Primary)      In:  85   IV (Primary)      In:  33.3   Brief Assessment:   Cardiac Irregular  murmur present  -- LE edema  -- JVD    Respiratory normal resp effort  clear BS    Gastrointestinal Normal    Gastrointestinal details normal Soft  Nontender  Bowel sounds normal   Routine Hem:  26-Apr-13 06:04    WBC (CBC) 10.4   RBC (CBC) 3.03   Hemoglobin (CBC) 8.1   Hematocrit (CBC) 26.6   Platelet Count (CBC) 250   MCV 88   MCH 26.8   MCHC 30.5   RDW 15.0  Routine Chem:  26-Apr-13 06:04    Glucose, Serum 109   BUN 12   Creatinine (comp) 0.61   Sodium, Serum 143   Potassium, Serum 3.8   Chloride, Serum 113   CO2, Serum 19   Calcium (Total), Serum 7.9   Anion Gap 11   Osmolality (calc) 285   eGFR (African American) >60   eGFR (Non-African American) >60  Cardiac:  26-Apr-13 06:04    Troponin I 0.07   CK, Total 93   CPK-MB, Serum 0.8  Routine Hem:  26-Apr-13 06:04    Neutrophil % 74.6   Lymphocyte % 14.7   Monocyte % 8.5   Eosinophil % 1.5   Basophil % 0.7   Neutrophil # 7.8   Lymphocyte # 1.5   Monocyte # 0.9   Eosinophil # 0.2   Basophil # 0.1  Routine Chem:  26-Apr-13 06:04    Magnesium, Serum 1.8  Hepatic:  26-Apr-13 06:04    Bilirubin,  Total 0.7   Bilirubin, Direct < 0.10   Alkaline Phosphatase 74   SGPT (ALT) 20   SGOT (AST) 59   Total Protein, Serum 5.9   Albumin, Serum 2.9  Blood Glucose:  26-Apr-13 09:34    POCT Blood Glucose 123  Routine Hem:  26-Apr-13 16:48    Hemoglobin (CBC) 8.4   Radiology Results: Cardiology:    25-Apr-13 12:21, ECG   Ventricular Rate 131   Atrial Rate 113   QRS Duration 66   QT 322   QTc 475   R Axis 42   T Axis 92   ECG interpretation    Atrial fibrillation with rapid ventricular response  Nonspecific ST and T wave abnormality , probably digitalis effect  Abnormal ECG  When compared with ECG of 26-May-2000 14:34,  Atrial fibrillation has replaced Sinus rhythm  Nonspecific T wave abnormality now evident in Lateral leads  ----------unconfirmed----------  Confirmed by OVERREAD, NOT ([id number]), editor  PEARSON, BARBARA ([id number]) on 02/26/2012 3:09:06 PM   ECG     25-Apr-13 18:58, Echo Doppler   Echo Doppler    Interpretation Summary    The left ventricle is grossly normal size. There is no thrombus. Left   ventricular systolic function is normal. Ejection Fraction = >55%.   There is normal left ventricular wall thickness. The left   ventricular wall motion is normal. The right ventricular systolic   function is normal. There is discrete nodular thickening of the left   coronary cusp. There is mild to moderate tricuspid regurgitation.   Right ventricular systolic pressure is elevated at 30-39mHg.    PatientHeight: 170 cm    PatientWeight: 77 kg    BSA: 1.9 m2    Procedure:    A two-dimensional transthoracic echocardiogram with color flow and   Doppler was performed.    The study was completed  bedside.    Technically a difficult study due to poorwindows.    Left Ventricle    Left ventricular systolic function is normal.    Ejection Fraction = >55%.    The left ventricular wall motion is normal.    The left ventricle is grossly normal size.    There is no thrombus.    There  is normal left ventricular wall thickness.    Right Ventricle    The right ventricle is grossly normal size.    There is normal right ventricular wall thickness.    The right ventricular systolic function is normal.    Atria    The left atrial size is normal.    Right atrial size is normal.    Mitral Valve    The mitral valve leaflets appear thickened, but open well.    There is mild mitral regurgitation.    Tricuspid Valve    The tricuspid valve is not well visualized, but is grossly normal.    There is mild to moderate tricuspid regurgitation.    Right ventricular systolic pressure is elevated at 30-468mg.    Aortic Valve    There is discrete nodular thickening of the left coronary cusp.    No aortic regurgitation is present.    Pulmonic Valve    The pulmonic valve is not well seen, but is grossly normal.    There is no pulmonic valvular regurgitation.    Great Vessels    The aortic root is not well visualized but is probably normal size.    Pericardium/Pleural    There is no pleural effusion.    No pericardial effusion.    MMode 2D Measurements and Calculations    RVDd: 2.5 cm    IVSd: 1.4 cm    LVIDd: 4.2 cm    LVIDs: 2.9 cm    LVPWd: 1.4 cm    FS: 32 %    EF(Teich): 60 %    Ao root diam: 3.0 cm    ACS: 1.6 cm    LA dimension: 3.6 cm    LVOT diam: 1.9 cm    Doppler Measurements and Calculations    MV E point: 161 cm/sec    MV V2 max: 145 cm/sec    MV max PG: 8.0 mmHg    MV V2 mean: 81 cm/sec    MV mean PG: 3.0 mmHg    MV V2 VTI: 31 cm    MV P1/2t max vel: 162 cm/sec    MV P1/2t: 69 msec    MVA(P1/2t): 3.2 cm2    MV dec slope: 683  cm/sec2    MV dec time: 0.22 sec    Ao V2 max: 194 cm/sec    Ao max PG: 15 mmHg    Ao V2 mean: 113 cm/sec    Ao mean PG: 6.2 mmHg    Ao V2 VTI: 29 cm    AVA(I,D): 2.1 cm2    AVA(V,D): 1.8 cm2    LV max PG: 6.0 mmHg    LV mean PG: 2.5 mmHg    LV V1 max: 123 cm/sec    LV V1 mean: 71 cm/sec    LV V1 VTI: 21 cm    MR  max vel: 377 cm/sec    MR max PG: 57 mmHg    SV(LVOT): 61 ml    PA V2 max: 109 cm/sec    PA max PG: 5.0 mmHg    PA acc time: 0.14 sec    TR Max vel: 278 cm/sec    TR Max PG: 31 mmHg    RVSP: 36 mmHg    RAP systole: 5.0 mmHg    PA pr(Accel): 16 mmHg    Reading Physician: Lujean Amel   Sonographer: Jaci Standard  Interpreting Physician:  Lujean Amel,  electronically signed on   02-26-2012 14:35:36  Requesting Physician: Lujean Amel   Assessment/Plan:  Invasive Device Daily Assessment of Necessity:   Does the patient currently have any of the following indwelling devices? none   Assessment/Plan:   Assessment IMP AFIB-RVR Anemia GI Bleeding GERD Abn Ekg Hyperlipidemia    Plan PLAN Agree with transfer to ICU IV Amiodarone load Switch to metoprolol from cardiazem Hold off on anticoug because of bleeding Transfuse to keep H/H 9/27 Proceed with EGD/Scope when HR controlled Agree with Protonics Consider CT of Abd/Pelvis Continue Statin therapy   Electronic Signatures: Lujean Amel D (MD)  (Signed 27-Apr-13 06:21)  Authored: Chief Complaint, VITAL SIGNS/ANCILLARY NOTES, Brief Assessment, Lab Results, Radiology Results, Assessment/Plan   Last Updated: 27-Apr-13 06:21 by Lujean Amel D (MD)

## 2015-02-24 NOTE — Discharge Summary (Signed)
PATIENT NAME:  Theresa Gaines, Theresa Gaines MR#:  [id number] DATE OF BIRTH:  1928/03/31  DATE OF ADMISSION:  02/25/2012 DATE OF DISCHARGE:  03/06/2012  ADMITTING DIAGNOSIS: GI bleed, anemia.   DISCHARGE DIAGNOSES:  1. Severe iron deficiency anemia. 2. Guaiac positive stool. 3. Melena. 4. Acute on chronic GI bleed status post 2 units of packed red blood cell transfusion.  5. Suspected ileocolitis with right lower quadrant abdominal pain status post EGD as well as colonoscopy. Colon diverticulosis was noted as well as prominent ileocecal valve status post biopsy. Biopsy results are pending.  6. New liver as well as spleen lesions, unknown etiology at this time.  7. Endometrial thickening on CT of abdomen.  8. Atrial fibrillation, rapid ventricular response, with normal echocardiogram, resolved after blood transfusion. 9. Hypokalemia. 10. Hypomagnesemia, resolved.  11. Left arm cellulitis, resolving.  12. Hypertension.  13. History of hyperlipidemia.  14. Osteoporosis.   PROCEDURES:  1. EGD done on 02/29/2012 by Dr. Bluford Kaufmann showing normal esophagus, hiatal hernia. Examination was otherwise normal. Normal examined duodenum.  2. Colonoscopy done on 03/03/2012 by Dr. Bluford Kaufmann revealing diverticulosis in the sigmoid colon and descending colon. Examination was otherwise normal. Possible sigmoid polyp. Prominent ileocecal valve.   DISCHARGE CONDITION: Stable.   DISCHARGE MEDICATIONS: The patient is to resume her outpatient medications which are:  1. Zolpidem 12.5 mg p.o. daily at bedtime.  2. Evista 60 mg p.o. daily.  3. Geritol 1 tablet once daily.  4. Prilosec over-the-counter 20 mg p.o. daily.  5. Iron tablets which were called Ferro-Sequels 150 mg (50 mg iron) 40 mg oral tablet extended-release 1 tablet once daily.  6. Allegra 60 mg p.o. twice daily.  7. Simvastatin 20 mg p.o. daily.   ADDITIONAL MEDICATIONS:  1. Augmentin 875 mg p.o. twice daily for five more days.  2. Metoprolol 100 mg p.o. twice daily.   3. Lisinopril 5 mg p.o. daily.   HOME OXYGEN: None.   DIET: 2 gram salt, low fat, low cholesterol.   PHYSICAL ACTIVITY LIMITATIONS: As tolerated.    FOLLOW-UP:  1. Follow-up appointment with Dr. Sherrlyn Hock in two days after discharge. 2. Follow-up with Dr. Bluford Kaufmann in one week after discharge.  3. Follow-up appointment with Dr. Dossie Arbour in two days after discharge.   CONSULTANTS:  1. Dr. Bluford Kaufmann  2. Dr. Sherrlyn Hock  3. Care Management   4. Dr. Juliann Pares  5. Dr. Niel Hummer    RADIOLOGIC STUDIES:  1. Chest, portable, single view, 02/28/2012, showed bilateral diffuse interstitial thickening likely representing interstitial edema versus interstitial pneumonitis secondary to infectious or inflammatory etiology.  2. Chest, PA and lateral, 03/03/2012, showed new right apical pulmonary density. This could represent a focal area of pneumonia which is new since 02/28/2012. Rounded calcific density in superior mediastinum to the right of midline. This could represent a subclavian artery aneurysm or calcified pleural mass. CT of the chest may prove useful for further evaluation. Interim clearing of pulmonary interstitial edema. Bilateral pleural effusions and cardiomegaly noted.  3. CT scan of abdomen and pelvis with contrast, 03/03/2012, showing two new large hepatic lesions suspicious for metastatic disease. Multiple splenic lesions again noted. Although these could be vascular lesions metastatic disease to the spleen cannot be excluded. Severe ileal as well as cecal wall thickening. This could be related to ileocolitis, however, malignancy cannot not be excluded. Diffuse rectosigmoid left colonic wall thickening also noted suggesting colitis. Mild endometrial thickening. Endometrial malignancy cannot be excluded. Bilateral pleural effusions as well as basilar atelectasis versus infiltrates were noted.  4. Echocardiogram 02/25/2012 left ventricle grossly normal size. There is no thrombus. Left ventricular systolic function  normal. Ejection fraction equal or more than 55%. There is normal left ventricular wall thickness. Left ventricular wall motion is normal. Right ventricular systolic function was normal. There is discrete nodular thickening of the left coronary cusp. There is mild to moderate tricuspid regurgitation. Right ventricular systolic pressure is elevated to 30 to 40 mmHg.  REASON FOR ADMISSION: The patient is an 81 year old Caucasian female with past medical history significant for history of osteoporosis and hyperlipidemia who presented to the hospital with complaints of low hemoglobin, was sent by primary care physician, Dr. Dossie Arbourrissman. Please refer to Dr. Eliane DecreePatel's admission note on 02/25/2012. Apparently the patient was having abdominal cramping and had melanotic stool. She was noted to have a hemoglobin level of 7 in her primary care physician's office and was sent to the Emergency Room for further evaluation. In the Emergency Room, she was also noted to have atrial fibrillation with RVR with heart rate of 140. This was new onset. She was admitted to the hospital.   PHYSICAL EXAMINATION: She was afebrile on admission. Her pulse was ranging from 140's to 145 in atrial fibrillation, respiration rate was 26, blood pressure 113/62, saturation was 100% on room air. Physical exam was unremarkable. Generalized pallor was also noted.   HOSPITAL COURSE:  1. In regards to anemia, as the patient had melanotic stools she was felt to have acute possibly on chronic GI blood loss anemia. Gastroenterologist, Dr. Bluford Kaufmannh, was consulted and the patient proceeded to EGD as well as colonoscopy. Initially EGD was unremarkable, however, colonoscopy was with some difficulty manipulating in the colon due to restricted mobility of the colon as well as diverticulosis of sigmoid as well as ascending colon, possible sigmoid colon polyp was noted and prominent ileocecal valve. The patient underwent biopsies of ileocecal area erythematous mucosa.  Those results are still pending. The patient underwent CT scanning of her abdomen and pelvis. As the patient's CT scan showed new lesions in her liver as well as multiple splenic lesions which were concerning for metastatic disease, consultation with Dr. Sherrlyn HockPandit was obtained as well. Dr. Sherrlyn HockPandit saw the patient in consultation on 03/04/2012. He explained to the patient as well as her daughter about findings of CT scan as well as patient's colonoscopy as well as EGD findings. He discussed also some tumor markers and recommended to await biopsy results and then make decisions about therapy if this turns out to be colon cancer. In that case if it was colon cancer Dr. Sherrlyn HockPandit felt that it would be stage IV disease which is incurable and treatments offered would be just palliative intent only. The patient had tumor markers ordered and they were noted to be CA 27.29 showed normal level of 9.1. CA-19-9 was elevated to 169. CEA level was found to be also elevated to 29.9. AFP was 5.1. CA-125 was elevated to 40.9. The patient was initiated on antibiotic therapy initially and continued during all her stay in the hospital time. With this therapy she improved significantly and she had no more bleeding. Her hemoglobin remained stable after 2 units of packed red blood cell transfusion. It was felt that patient should continue antibiotic therapy and follow-up with Dr. Sherrlyn HockPandit for biopsy results and make decisions about therapy if biopsy results show cancer. She is to continue Augmentin at 875 mg p.o. twice daily dose for five more days.  2. In regards to her severe anemia, the patient was  transfused as mentioned above and the patient's hemoglobin remained stable. On day of discharge the patient's hemoglobin level is 10.0. It is recommended to follow the patient's hemoglobin levels as outpatient and make decisions about transfusion or iron supplementation if needed.  3. In regards to atrial fibrillation/RVR, the patient was evaluated  by Dr. Juliann Pares. Echocardiogram was performed which was unremarkable in regards to cardiac changes. The patient was advised to continue metoprolol daily. She is being discharged on metoprolol. She converted to sinus rhythm after transfusion of packed red blood cells and it was felt that the patient's atrial fibrillation could have been symptomatic related just to severe anemia. The patient is to continue metoprolol for management of her blood pressure because she was noted to have severe hypertension while in the hospital. Hypertension was still not well controlled even on metoprolol and lisinopril was added on day of discharge. It is recommended to follow the patient's blood pressure readings and decrease those medications if needed as it was felt that the patient's blood pressure readings can change at home and here in the hospital they probably are exacerbated by stress. 4. The patient was noted to be to be hypokalemic as well as hypomagnesemic in the hospital intermittently. She received IV as well as p.o. supplementations of magnesium as well as potassium and her magnesium as well as potassium level normalized by the day of discharge.  5. The patient had an episode of left arm cellulitis which was felt to be due to IV line. This line was removed and the patient continued antibiotic therapy. The patient cellulitis has been resolving. She is to continue antibiotics for five more days.  6. The patient, as mentioned above, has history of hyperlipidemia as well as osteoporosis. The patient is to continue her outpatient medications.  7. For anemia, as mentioned above, the patient required 2 units of packed red blood cell transfusion. After transfusion the patient's hemoglobin remained stable. The patient is to continue iron supplements as well as Prilosec. Her primary care physician is to have her hemoglobin checked every so often to ensure its stability. The patient is to follow-up with Dr. Bluford Kaufmann for further  recommendations as well as Dr. Sherrlyn Hock. She is to follow-up with her primary physician, Dr. Dossie Arbour, and make decisions about therapy of iron deficiency anemia in the future as well as suspected colon cancer in the future.   TIME SPENT: 40 minutes.   ____________________________ Katharina Caper, MD rv:drc D: 03/06/2012 15:08:10 ET T: 03/06/2012 15:40:31 ET JOB#: [id number]  cc: Katharina Caper, MD, <Dictator> Ezzard Standing. Bluford Kaufmann, MD Sandeep R. Sherrlyn Hock, MD Steele Sizer, MD Katharina Caper MD ELECTRONICALLY SIGNED 03/12/2012 14:35

## 2015-02-24 NOTE — Consult Note (Signed)
ONCOLOGY Consult Note - Physician: Dr. Carole CivilKalisettiPhysician: Dr.Kamarie Palma for Consult: liver lesions, ileocecal valve thickening.  HISTORY OF PRESENT ILLNESS:  patient is a 81 year old female with past medical history of osteoporosis and hyperlipidemia who has been admitted to hospital with complaints of abdominal cramping pain on and off for few weeks. Also hemoglobin was low at 7.2. CT scan of abdomen shows multiple liver abnormalities s/o metastasis, ileocoecal valve thickening and splenic lesions. Colonoscopy shows mass in ileocecal valve area, biopsy report is pending. Patient clinically states abdominal pain is better, still weak overall. Denies any chest pain or shortness of breath. No fevers.Marland Kitchen.  MEDICAL HISTORY:  1. Osteoporosis.  2. Hyperlipidemia.  SURGICAL HISTORY: None.   1. Allegra 60 mg p.o. b.i.d.  2. Evista 60 mg p.o. daily.  Ferrous sulfate 150 mg/40 daily. Geritol one tablet daily.   Prilosec over-the-counter 20 mg p.o. daily.   Simvastatin 20 mg p.o. daily. Zolpidem 12.5 mg extended-release at bedtime.  OF SYSTEMS: fatigue, weakness. Denies any weight loss. No tinnitus, ear pain, hearing loss. No cough, wheeze, hemoptysis. No chest pain, orthopnea, edema. No nausea, vomiting, diarrhea. Positive for melena. Positive for abdominal pain. No dysuria or hematuria. No polyuria or nocturia. No anemia or easy bruising. No acne, rash. Positive for arthritis. No focal weakness or seizuresNo anxiety or depression.   EXAMINATION: Awake, alert, oriented x3, not in acute distress. Mild pallor. Afebrile, BP stable. 100% on room air. Atraumatic, normocephalic. Extraocular movements intact. Oral mucosa is moist. Supple. No adenopathyClear to auscultation bilaterally. No rhonchi.S1S2, regular.Soft. Mild tenderness in the epigastrium and RLQ.intact cranial nerves, nonfocal.no rash.  DIAGNOSTIC, AND RADIOLOGICAL DATA: On admission, Hemoglobin and hematocrit 7.2 and 24.1, white count 10.2, platelet count 269,  MCV 90, glucose 107, BUN 15, creatinine 0.9, sodium 143, potassium 3.8, chloride 112, bicarb 17. Troponin 0.07.  with abdominal pain, workup shows ileocecal mass on colonoscopy wit biopsy report pending, CT scan shows multiple liver lesions and also splenic lesions rasining s/o metastatic disease. Patient and her daughter present were explained about above findings and that she most likely has metastatic malignancy from colon primary versus other. Serum tumor markers including CEA level is pending. Plan is to await report of biopsy, follwing whcih will make treatment planning. They were also explained that if it turns out to be colon Ca, this would be stage IV disease which is incurable and treatments offered are with palliative intent only. Patient is agreeable to this plan.you for the referral, please feel free to call me if any questions.     Electronic Signatures: Izola PricePandit, Bennett Ram Raj (MD)  (Signed on 03-May-13 23:36)  Authored  Last Updated: 03-May-13 23:36 by Izola PricePandit, Tayshawn Purnell Raj (MD)

## 2015-02-24 NOTE — Consult Note (Signed)
Chief Complaint:   Subjective/Chief Complaint Still with abd pain and diarrhea. CT results noted.   VITAL SIGNS/ANCILLARY NOTES: **Vital Signs.:   03-May-13 14:12   Temperature Source oral   Pulse Pulse 66   Pulse source per Dinamap   Respirations Respirations 20   Systolic BP Systolic BP 444   Diastolic BP (mmHg) Diastolic BP (mmHg) 73   Mean BP 97   BP Source Dinamap   Pulse Ox % Pulse Ox % 93   Pulse Ox Activity Level  At rest   Oxygen Delivery Room Air/ 21 %   Brief Assessment:   Cardiac Regular    Respiratory clear BS    Gastrointestinal Normal   Routine Hem:  02-May-13 04:27    WBC (CBC) 9.1   RBC (CBC) 3.76   Hemoglobin (CBC) 10.4   Hematocrit (CBC) 32.0   Platelet Count (CBC) 188   MCV 85   MCH 27.6   MCHC 32.4   RDW 14.3  Routine Chem:  02-May-13 04:27    Glucose, Serum 103   BUN 5   Creatinine (comp) 0.61   Sodium, Serum 142   Potassium, Serum 3.3   Chloride, Serum 110   CO2, Serum 23   Calcium (Total), Serum 8.0   Anion Gap 9   Osmolality (calc) 281   eGFR (African American) >60   eGFR (Non-African American) >60  Routine Hem:  02-May-13 04:27    Neutrophil % 72.0   Lymphocyte % 12.7   Monocyte % 11.0   Eosinophil % 3.2   Basophil % 1.1   Neutrophil # 6.6   Lymphocyte # 1.2   Monocyte # 1.0   Eosinophil # 0.3   Basophil # 0.1  Routine Chem:  02-May-13 04:27    Magnesium, Serum 1.7   Assessment/Plan:  Assessment/Plan:   Assessment Cecal mass/ileitis? Liver mets?    Plan Await colon bx. If bx unrevealing, then recommend liver bx later. Will check back on Monday. Thanks. If there are questions tomorrow, then contact GI on call. Thanks.   Electronic Signatures: Verdie Shire (MD)  (Signed [phone] 15:30)  Authored: Chief Complaint, VITAL SIGNS/ANCILLARY NOTES, Brief Assessment, Lab Results, Assessment/Plan   Last Updated: 03-May-13 15:30 by Verdie Shire (MD)

## 2015-02-24 NOTE — H&P (Signed)
PATIENT NAME:  Theresa Gaines, Theresa Gaines MR#:  [id number] DATE OF BIRTH:  Jun 17, 1928  DATE OF ADMISSION:  02/25/2012  PRIMARY CARE PHYSICIAN: Vonita Moss, MD   CHIEF COMPLAINT: Sent here for low hemoglobin.   HISTORY OF PRESENT ILLNESS: Theresa Gaines is a pleasant 81 year old Caucasian female with past medical history of osteoporosis and hyperlipidemia who comes to the Emergency Room after she was seen by her primary care physician, Dr. Dossie Arbour, today. The patient has been complaining of abdominal cramping pain on and off for the past two weeks, more so for the past four days with mid abdominal cramping, comes and goes, with no precipitating or aggravating factors, relieved by ibuprofen. She has been taking ibuprofen at home and started noticing melanotic stool for the past two weeks. Her hemoglobin today was 7.8; repeat hemoglobin in the ER was 7.2. She is being admitted for further evaluation on her GI bleed. In the Emergency Room the patient was noted to have atrial fibrillation, rapid, with heart rate in the 140's, new onset. The patient denies any cardiac history. Denies any chest pain or shortness of breath. She denies any palpitations either. In the Emergency Room the patient has been ordered to receive IV Cardizem for that.   PAST MEDICAL HISTORY:  1. Osteoporosis.  2. Hyperlipidemia.   PAST SURGICAL HISTORY: None.   PROCEDURES:  1. The patient had an endoscopy, EGD, done in September of 2012 which showed hiatal hernia. This was done by Dr. Cecelia Byars.  2. Video capsule endoscopy was done in October 2012 and was negative. This was done by Dr. Niel Hummer.  3. Colonoscopy done in 2006 was done by Dr. Maryruth Bun which showed one polyp in the proximal sigmoid colon and small mouthed diverticulum found in the sigmoid colon.   MEDICATIONS:  1. Allegra 60 mg p.o. b.i.d.  2. Evista 60 mg p.o. daily.  3. Ferrous sulfate 150 mg/40 daily. 4. Geritol one tablet daily.   5. Prilosec over-the-counter 20 mg p.o. daily.    6. Simvastatin 20 mg p.o. daily. 7. Zolpidem 12.5 mg extended-release at bedtime.   REVIEW OF SYSTEMS: CONSTITUTIONAL: Positive for fatigue, weakness. EYES: No blurred or double vision. Denies any weight loss. ENT: No tinnitus, ear pain, hearing loss. RESPIRATORY: No cough, wheeze, hemoptysis. CARDIOVASCULAR: No chest pain, orthopnea, edema. GI: No nausea, vomiting, diarrhea. Positive for melena. Positive for abdominal pain. The patient denies GERD symptoms. GU: No dysuria or hematuria. ENDOCRINE: No polyuria or nocturia. HEMATOLOGY: No anemia or easy bruising. SKIN: No acne, rash. MUSCULOSKELETAL: Positive for arthritis. NEUROLOGIC: No CVA or TIA. PSYCH: No anxiety or depression. All other systems reviewed and negative.   PHYSICAL EXAMINATION:   GENERAL: The patient is awake, alert, oriented x3, not in acute distress.   VITAL SIGNS: Afebrile, pulse 140 to 145, atrial fibrillation, respirations 26, blood pressure 113/62, sats are 100% on room air.   HEENT: Atraumatic, normocephalic. Pupils equal, round, and reactive to light and accommodation. Extraocular movements intact. Oral mucosa is moist.   NECK: Supple. No JVD. No carotid bruit.   RESPIRATORY: Clear to auscultation bilaterally. No rales, rhonchi, respiratory distress, or labored breathing.   CARDIOVASCULAR: Tachycardia. Both the heart sounds are normal. No murmur heard. No chest tenderness.   ABDOMEN: Soft. There is some diffuse tenderness in the mid abdomen. No guarding, rigidity, or organomegaly. Bowel sounds are present.   NEUROLOGIC: Grossly intact cranial nerves II through XII. No motor or sensory deficits.   PSYCH: The patient is awake, alert, and oriented x3.  SKIN: Warm and dry. Generalized pallor present.   LABORATORY, DIAGNOSTIC, AND RADIOLOGICAL DATA: Hemoglobin and hematocrit 7.2 and 24.1, white count 10.2, platelet count 269, MCV 90, glucose 107, BUN 15, creatinine 0.9, sodium 143, potassium 3.8, chloride 112, bicarb  17. Troponin 0.07.   EKG shows rapid atrial fibrillation with RVR.   ASSESSMENT: 81 year old Theresa Gaines with:  1. Abdominal pain, mid abdomen, with melena and coffee-ground emesis today, hemoglobin of 7.2. EGD in September of 2012 showed hiatal hernia. Video capsule endoscopy negative. This was done in October of 2012. Colonoscopy in 2006 showed one polyp that was removed in the sigmoid colon along with sigmoid diverticula.  2. Rapid atrial fibrillation with RVR, new onset.  3. Hyperlipidemia.  4. Osteoporosis.   PLAN:  1. Admit patient to telemetry floor.  2. Will convert to CCU if heart rate does not improve with oral/IV p.r.n. Cardizem.  3. FULL CODE.  4. IV fluids.  5. GI consultation with Dr. Bluford Kaufmannh. Case was discussed with Dr. Bluford Kaufmannh. Continue IV Protonix.  6. Will get CT of abdomen and pelvis after GI evaluates the patient. The patient will probably need colonoscopy and possible endoscopy as well.    7. Clear liquid diet.  8. Rapid atrial fibrillation with RVR, new onset. Will cycle cardiac enzymes x3. IV Cardizem 20 mg now, then 30 mg p.o. q.6 hours. Holding parameters of blood pressure.  9. Echo Doppler of the heart.  10. Dr. Juliann Paresallwood to see the patient.  11. Given anemia, I will hold off on any anticoagulation at present and will hold off on any antiplatelet agents.   12. Hyperlipidemia. Continue statins.  13. Osteoporosis. On Evista.  14. DVT prophylaxis with SCDs and TEDs.   Above was discussed with the patient and the patient's husband who is agreeable to it.   TIME SPENT: 50 minutes.   ____________________________ Wylie HailSona A. Allena KatzPatel, MD sap:drc D: 02/25/2012 16:19:58 ET T: 02/25/2012 16:46:52 ET JOB#: [id number]  cc: Fay Bagg A. Allena KatzPatel, MD, <Dictator> Steele SizerMark A. Crissman, MD Willow OraSONA A Ryane Konieczny MD ELECTRONICALLY SIGNED 02/29/2012 23:14

## 2015-02-24 NOTE — Consult Note (Signed)
PATIENT NAME:  Theresa Gaines, Theresa Gaines MR#:  [id number] DATE OF BIRTH:  June 10, 1928  DATE OF CONSULTATION:  03/07/2012  REFERRING PHYSICIAN:  Olivia Mackie, MD CONSULTING PHYSICIAN:  Darrick Meigs, MD  PRIMARY CARE PHYSICIAN: Vonita Moss, MD  CHIEF COMPLAINT: Nausea, vomiting, and abdominal pain.   HISTORY OF PRESENT ILLNESS: The patient is an 81 year old female with a past medical history of osteoporosis and hyperlipidemia who had a prolonged hospitalization at [facility] from 02/25/2012 to 03/06/2012 at which time she was found to have possible metastatic colon cancer. The patient underwent an EGD and colonoscopy. 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 Biopsies were taken. The biopsy results are currently pending. She was also found to have metastatic lesions in the liver and spleen. She had severe iron deficiency anemia and acute on chronic GI bleed and received a blood transfusion. She had some atrial fibrillation. Her echocardiogram was normal. The atrial fibrillation resolved after the patient's blood transfusion. The patient was tolerating a soft diet prior to discharge. The patient was discharged home on 03/06/2012. She went home and was doing well. She had poor appetite and did not eat much overnight, did not eat much for dinner, but was well. This morning when the patient woke up she felt sick. She had generalized abdominal pain, more in the lower quadrants. She started having severe nausea and vomiting and started vomiting and spitting up coffee-ground material. Therefore, her family brought her to the emergency room.  ALLERGIES: Erythromycin and morphine sulfate.  MEDICATIONS: 1. Ambien 12.5 mg at bedtime. 2. Evista 60 mg daily.  3. Geritol one tablet daily.  4. Prilosec 20 mg daily. 5. Iron tablets 150 mg once a day. 6. Allegra 60 mg daily.  7. Simvastatin 20 mg daily.  8. Augmentin 875 mg twice a day. 9. Lopressor 100 mg twice a day. 10. Lisinopril 5 mg daily.   PAST MEDICAL HISTORY:   1. Osteoporosis.  2. Hyperlipidemia.  3. Recent admission for severe iron deficiency anemia and acute on chronic GI bleed status post 2 units of blood. 4. 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  5. Atrial fibrillation, currently in sinus. 6. Electrolyte abnormalities. 7. Left arm cellulitis.  8. Hypertension.  PAST SURGICAL HISTORY: Recent colonoscopy with biopsy of ileocecal mass and endoscopy.   SOCIAL HISTORY: There is no history of smoking, alcohol, or drug abuse. The patient is married and lives with her husband.  FAMILY HISTORY: No history of coronary artery disease or cerebrovascular accident.   REVIEW OF SYSTEMS: CONSTITUTIONAL: The patient denies any fever. Reports fatigue and weakness. EYES: Denies any blurred or double vision. ENT: Denies any tinnitus or ear pain. RESPIRATORY: Denies any cough or wheezing. CARDIOVASCULAR: Denies any chest pain or palpitations. GI: Reports nausea, vomiting, abdominal pain, and spitting up coffee-ground material. GASTROINTESTINAL: Denies any dysuria or hematuria. ENDOCRINE: Denies any polyuria or nocturia. HEME/LYMPH: Had recent anemia. Denies any easy bruisability. INTEGUMENT: Denies any acne or rash. MUSCULOSKELETAL: Denies any swelling or gout. NEUROLOGICAL: Denies any numbness or weakness. PSYCH: Denies any anxiety or insomnia.   PHYSICAL EXAMINATION:   VITAL SIGNS: Temperature 98.1, heart rate 78, respiratory rate 18, blood pressure 143/71, and pulse oximetry 100%.   GENERAL: The patient is an elderly Caucasian female lying in bed. She is in moderate distress. Intermittently she is very nauseous and is throwing up/spitting coffee-ground material/mucus.    HEAD: Atraumatic, normocephalic.   EYES: There is pallor. No icterus or cyanosis. Pupils are equally  round and reactive to light and accommodation. Extraocular movements are intact.   ENT: The patient's tongue is coated with black material.    NECK: Supple. No masses. No JVD or thyromegaly.   CHEST WALL: No tenderness to palpation. Not using accessory muscles of respiration. No intracostal muscle retraction.   LUNGS: Bilaterally clear to auscultation. No wheezing, rales, or rhonchi.   HEART: S1 and S2 irregularly irregular. No murmurs, rubs, or gallops.  ABDOMEN: Soft, mildly distended. There is no guarding, no rigidity. Mild diffuse tenderness, especially in the epigastrium and the lower quadrants. Hypoactive bowel sounds.   SKIN: The patient has some ecchymosis on her upper extremities. No other rashes or lesions.   PERIPHERIES: No pedal edema. 2+ pedal pulses.   MUSCULOSKELETAL: No cyanosis or clubbing.   NEUROLOGIC: Awake, alert, and oriented. Nonfocal neurological exam.   PSYCH: Very anxious.   RESULTS: Abdominal 3-way shows pulmonary fibrosis, pulmonary vascular congestion, and nonobstructive bowel gas pattern.   Glucose 138, BUN 12, creatinine 0.67, sodium 140, potassium 3.9, chloride 107, CO2 24, calcium 8.9, bilirubin 0.4, alkaline phosphatase 114, ALT 44, and AST 44. White count 9.3, hemoglobin 11.3, hematocrit 35.6, and platelets 246. Potassium 3.5.   ASSESSMENT:  1. Coffee-ground emesis, nausea, vomiting, abdominal pain. 2. Hyperglycemia. 3. Elevated LFTs. 4. Anemia. 5. Recent diagnosis of possible colon cancer/ileocecal mass with liver and spleen lesions. Pathology is pending. 6. History of severe iron deficiency anemia and acute on chronic GI bleed requiring blood transfusion. 7. Atrial fibrillation.  8. Hypertension. 9. Hyperlipidemia.  10. Osteoporosis.   PLAN: I discussed the plan of management including admission with the patient and her family. I discussed previous labs and medical records including documentation by the gastroenterologist and oncologist, Dr. Janese BanksSandeep Pandit. The patient's [facility]. The ER physician, Dr. Olivia MackieGina  Martin, was informed. The patient has ongoing nausea and vomiting and is throwing up coffee-ground material. She would benefit from a NG tube. I reviewed all medical records, discussed with the ER physician, and discussed with the patient and her family the plan of care and management.   TIME SPENT: 75 minutes.  ____________________________ Darrick MeigsSangeeta Shanette Tamargo, MD sp:slb D: 03/07/2012 17:59:00 ET T: 03/08/2012 09:58:28 ET JOB#: [id number]  cc: Steele SizerMark A. Crissman, MD Darrick MeigsSangeeta Joevanni Roddey, MD, <Dictator>  Darrick MeigsSANGEETA Monai Hindes MD ELECTRONICALLY SIGNED 03/09/2012 16:26

## 2015-02-24 NOTE — Consult Note (Signed)
Chief Complaint:   Subjective/Chief Complaint Overall same. No bowel movements. HR overall better but fluctuates a lot. On Amiodarone.   VITAL SIGNS/ANCILLARY NOTES: **Vital Signs.:   28-Apr-13 09:00   Vital Signs Type Routine   Pulse Pulse 104   Respirations Respirations 29   Systolic BP Systolic BP 138   Diastolic BP (mmHg) Diastolic BP (mmHg) 52   Mean BP 80   Pulse Ox % Pulse Ox % 98   Pulse Ox Activity Level  At rest   Oxygen Delivery Room Air/ 21 %   Pulse Ox Heart Rate 90   Routine Hem:  28-Apr-13 06:19    WBC (CBC) 11.0   RBC (CBC) 2.97   Hemoglobin (CBC) 8.1   Hematocrit (CBC) 25.8   Platelet Count (CBC) 225   MCV 87   MCH 27.1   MCHC 31.2   RDW 14.2   Neutrophil % 84.4   Lymphocyte % 8.8   Monocyte % 6.3   Eosinophil % 0.4   Basophil % 0.1   Neutrophil # 9.3   Lymphocyte # 1.0   Monocyte # 0.7   Eosinophil # 0.0   Basophil # 0.0  Blood Glucose:  28-Apr-13 07:34    POCT Blood Glucose 146   Assessment/Plan:  Assessment/Plan:   Assessment Melena and anemia. H and H unchanged. A. fibb with rapid heart rate. Still on Amiodarone.    Plan EGD once HR is more stable. Follow H and H. Dr. Bluford Kaufmannh will resume care in am.   Electronic Signatures: Lurline DelIftikhar, Christoher Drudge (MD)  (Signed 28-Apr-13 11:32)  Authored: Chief Complaint, VITAL SIGNS/ANCILLARY NOTES, Lab Results, Assessment/Plan   Last Updated: 28-Apr-13 11:32 by Lurline DelIftikhar, Alassane Kalafut (MD)
